# Patient Record
Sex: Male | Born: 1935 | Race: White | Hispanic: No | Marital: Married | Smoking: Current some day smoker
Health system: Southern US, Community
[De-identification: ages and names within clinical notes are randomized; demographics above are authoritative.]

## PROBLEM LIST (undated history)

## (undated) DIAGNOSIS — I714 Abdominal aortic aneurysm, without rupture, unspecified: Secondary | ICD-10-CM

## (undated) DIAGNOSIS — I10 Essential (primary) hypertension: Secondary | ICD-10-CM

## (undated) DIAGNOSIS — M4310 Spondylolisthesis, site unspecified: Secondary | ICD-10-CM

## (undated) DIAGNOSIS — M5136 Other intervertebral disc degeneration, lumbar region: Secondary | ICD-10-CM

## (undated) DIAGNOSIS — G20A1 Parkinson's disease without dyskinesia, without mention of fluctuations: Secondary | ICD-10-CM

## (undated) DIAGNOSIS — G20 Parkinson's disease: Secondary | ICD-10-CM

## (undated) DIAGNOSIS — J449 Chronic obstructive pulmonary disease, unspecified: Secondary | ICD-10-CM

## (undated) DIAGNOSIS — M51369 Other intervertebral disc degeneration, lumbar region without mention of lumbar back pain or lower extremity pain: Secondary | ICD-10-CM

## (undated) DIAGNOSIS — R918 Other nonspecific abnormal finding of lung field: Secondary | ICD-10-CM

## (undated) HISTORY — PX: HERNIA REPAIR: SHX51

## (undated) HISTORY — DX: Other intervertebral disc degeneration, lumbar region without mention of lumbar back pain or lower extremity pain: M51.369

## (undated) HISTORY — DX: Other intervertebral disc degeneration, lumbar region: M51.36

## (undated) HISTORY — PX: JOINT REPLACEMENT: SHX530

## (undated) HISTORY — DX: Abdominal aortic aneurysm, without rupture: I71.4

## (undated) HISTORY — DX: Spondylolisthesis, site unspecified: M43.10

## (undated) HISTORY — PX: TOTAL KNEE ARTHROPLASTY: SHX125

## (undated) HISTORY — DX: Abdominal aortic aneurysm, without rupture, unspecified: I71.40

---

## 1999-09-11 ENCOUNTER — Ambulatory Visit (HOSPITAL_COMMUNITY): Admission: RE | Admit: 1999-09-11 | Discharge: 1999-09-11 | Payer: Self-pay | Admitting: Gastroenterology

## 1999-09-11 ENCOUNTER — Encounter (INDEPENDENT_AMBULATORY_CARE_PROVIDER_SITE_OTHER): Payer: Self-pay | Admitting: Specialist

## 2004-11-24 ENCOUNTER — Ambulatory Visit (HOSPITAL_COMMUNITY): Admission: RE | Admit: 2004-11-24 | Discharge: 2004-11-25 | Payer: Self-pay | Admitting: Orthopedic Surgery

## 2006-09-20 ENCOUNTER — Ambulatory Visit (HOSPITAL_COMMUNITY): Admission: RE | Admit: 2006-09-20 | Discharge: 2006-09-21 | Payer: Self-pay | Admitting: General Surgery

## 2009-06-09 ENCOUNTER — Encounter: Admission: RE | Admit: 2009-06-09 | Discharge: 2009-06-09 | Payer: Self-pay | Admitting: Neurology

## 2009-09-30 ENCOUNTER — Encounter: Admission: RE | Admit: 2009-09-30 | Discharge: 2009-11-20 | Payer: Self-pay | Admitting: Neurology

## 2010-01-06 ENCOUNTER — Encounter: Admission: RE | Admit: 2010-01-06 | Discharge: 2010-01-06 | Payer: Self-pay | Admitting: Neurology

## 2010-01-15 ENCOUNTER — Encounter: Admission: RE | Admit: 2010-01-15 | Discharge: 2010-02-09 | Payer: Self-pay | Admitting: Neurology

## 2011-04-09 NOTE — Op Note (Signed)
NAME:  Jose Vega, Jose Vega                ACCOUNT NO.:  [id number]   MEDICAL RECORD NO.:  [id number]          PATIENT TYPE:  OIB   LOCATION:  1409                         FACILITY:  [facility]   PHYSICIAN:  Gita Kudo, M.D. DATE OF BIRTH:  1934-08-13   DATE OF PROCEDURE:  09/20/2006  DATE OF DISCHARGE:                                 OPERATIVE REPORT   OPERATIVE PROCEDURE:  Repair right inguinal hernia - combined direct and  indirect, excision lipomas of cord, Kugel-Bard two piece mesh.   SURGEON:  Gita Kudo, M.D.   ANESTHESIA:  General.   PREOPERATIVE DIAGNOSIS:  Right inguinal hernia.   POSTOPERATIVE DIAGNOSIS:  Right inguinal hernia, combined direct, indirect,  as well as lipomas of the cord.   CLINICAL SUMMARY:  77 year old male with enlarging bulge in his right groin.  It is symptomatic and he would like it repaired.  Physical examination  discloses a medium large right inguinal hernia.   OPERATIVE FINDINGS:  The patient had an indirect hernia sac that was empty  of contents at the time of surgery.  He had a medium sized right direct  hernia.  He also had lipomas of the cord.  The cord structures and nerves  were identified and not injured.   OPERATIVE PROCEDURE:  Under satisfactory general endotracheal anesthesia,  having received 1 grams Ancef preop, the patient was positioned, prepped and  draped in a standard fashion.  A total of 30 mL of 0.5% Marcaine with  epinephrine was infiltrated for postoperative analgesia.  A transverse right  lower quadrant incision was made and carried down to and through the  external ring and external oblique.  Bleeders were coagulated or tied with  Vicryl.  The cord and its contents mobilized with a Penrose drain, self-  retaining retractors placed, and with excellent exposure, the cord was  encircled with a Penrose and dissected.  The indirect sac identified,  dissected from the lipomas, and ligated high with 0 Prolene suture and  excess trimmed away.  The lipomas, likewise, were dissected high and tied  with 3-0 Vicryl, and excised.  The floor of the canal was then opened with  cautery from the pubis to the internal ring.  Finger dissection used to  reduce the preperitoneal fatty contents and they were held away with a  moistened gauze.  The circular portion of Kugel mesh was anchored at  Cooper's ligament with a 0 Prolene suture and unfolded inferiorly and  laterally.  Then, the gauze was removed and the mesh unfolded superiorly and  medially.  It lay in excellent position and a second 0 Prolene suture was  used to tack the medial portion to the under surface of the abdominal wall.  The mesh extended from the pubis to the internal ring and lay beneath the  inferior epigastric vessels.  The floor was then closed over the mesh with a  running 0 Prolene taking intermittent bites of the mesh and when tied at the  internal ring, the ring was snug and the ends of the suture left long.  The  oval portion  of the mesh was then tailored to fit in the floor with a slit  made to go around the cord structures.  It was anchored at the internal ring  with the previous suture and then tacked around the periphery.  Three  interrupted 0 Prolene sutures were used to approximate it to the internal  oblique, soft tissue near the pubis, and inguinal ligament.  The tails were  brought around the cord and sutured to each other.  The wound was then  checked for hemostasis, lavaged with saline, and  closed in layers.  Running 2-0 Vicryl approximated the external oblique,  interrupted 2-0 Vicryl for the deep fascia, 3-0 Vicryl for subcu, Steri-  Strips for skin.  Sterile absorbent dressings were then applied.  He  tolerated the procedure well.  No complications.  To the recovery room in  good condition.           ______________________________  Gita Kudo, M.D.     MRL/MEDQ  D:  09/20/2006  T:  09/20/2006  Job:  [id number]

## 2011-10-01 ENCOUNTER — Other Ambulatory Visit: Payer: Self-pay | Admitting: Neurology

## 2011-10-01 DIAGNOSIS — M545 Low back pain: Secondary | ICD-10-CM

## 2011-10-07 ENCOUNTER — Ambulatory Visit
Admission: RE | Admit: 2011-10-07 | Discharge: 2011-10-07 | Disposition: A | Discharge status: Home / Self Care | Payer: Medicare Other | Source: Ambulatory Visit | Attending: Neurology | Admitting: Neurology

## 2011-10-07 DIAGNOSIS — M545 Low back pain: Secondary | ICD-10-CM

## 2012-05-26 ENCOUNTER — Emergency Department (HOSPITAL_COMMUNITY): Payer: Medicare Other

## 2012-05-26 ENCOUNTER — Emergency Department (HOSPITAL_COMMUNITY)
Admission: EM | Admit: 2012-05-26 | Discharge: 2012-05-26 | Disposition: A | Discharge status: Home / Self Care | Payer: Medicare Other | Attending: Emergency Medicine | Admitting: Emergency Medicine

## 2012-05-26 ENCOUNTER — Encounter (HOSPITAL_COMMUNITY): Payer: Self-pay | Admitting: *Deleted

## 2012-05-26 DIAGNOSIS — M25551 Pain in right hip: Secondary | ICD-10-CM

## 2012-05-26 DIAGNOSIS — G20A1 Parkinson's disease without dyskinesia, without mention of fluctuations: Secondary | ICD-10-CM | POA: Insufficient documentation

## 2012-05-26 DIAGNOSIS — I10 Essential (primary) hypertension: Secondary | ICD-10-CM | POA: Insufficient documentation

## 2012-05-26 DIAGNOSIS — M25559 Pain in unspecified hip: Secondary | ICD-10-CM | POA: Insufficient documentation

## 2012-05-26 DIAGNOSIS — G20 Parkinson's disease: Secondary | ICD-10-CM | POA: Insufficient documentation

## 2012-05-26 HISTORY — DX: Parkinson's disease: G20

## 2012-05-26 HISTORY — DX: Essential (primary) hypertension: I10

## 2012-05-26 HISTORY — DX: Parkinson's disease without dyskinesia, without mention of fluctuations: G20.A1

## 2012-05-26 LAB — BASIC METABOLIC PANEL
BUN: 21 mg/dL (ref 6–23)
CO2: 29 mEq/L (ref 19–32)
Calcium: 9.4 mg/dL (ref 8.4–10.5)
Chloride: 104 mEq/L (ref 96–112)
Creatinine, Ser: 1.27 mg/dL (ref 0.50–1.35)
GFR calc Af Amer: 62 mL/min — ABNORMAL LOW (ref >90)
Glucose, Bld: 94 mg/dL (ref 70–99)
Potassium: 4.3 mEq/L (ref 3.5–5.1)
Sodium: 141 mEq/L (ref 135–145)

## 2012-05-26 LAB — CBC
HCT: 40.5 % (ref 39.0–52.0)
MCH: 29 pg (ref 26.0–34.0)
MCHC: 32.8 g/dL (ref 30.0–36.0)
Platelets: 218 10*3/uL (ref 150–400)
RBC: 4.58 MIL/uL (ref 4.22–5.81)
RDW: 13.9 % (ref 11.5–15.5)
WBC: 8.9 10*3/uL (ref 4.0–10.5)

## 2012-05-26 MED ORDER — HYDROCODONE-ACETAMINOPHEN 5-500 MG PO TABS: 1.0000 | ORAL_TABLET | Freq: Four times a day (QID) | ORAL | Status: AC | PRN

## 2012-05-26 MED ORDER — KETOROLAC TROMETHAMINE 60 MG/2ML IM SOLN
60.0000 mg | Freq: Once | INTRAMUSCULAR | Status: AC
  Administered 2012-05-26: 60 mg via INTRAMUSCULAR
  Filled 2012-05-26: qty 2

## 2012-05-26 NOTE — ED Notes (Signed)
RUE:[id number]<JW> Expected date:05/26/12<BR> Expected time: 8:22 PM<BR> Means of arrival:Ambulance<BR> Comments:<BR> Hip injury

## 2012-05-26 NOTE — ED Notes (Signed)
Per EMS:  Pt is from home, st's around 3 weeks ago he was mowing his lawn and went into a hole and tried to pick up mower and started having hip pain.  Pain has only gotten worse since then and has now radiated to L hip and he can no longer ambulate due to the severe pain.  Pt able to move legs.

## 2012-05-26 NOTE — ED Provider Notes (Signed)
History     CSN: [id number]  Arrival date & time 05/26/12  2025   First MD Initiated Contact with Patient 05/26/12 2047      No chief complaint on file.    The history is provided by the patient.   the patient reports developing left hip pain approximately 3 weeks ago and then over the past week has developed new right hip pain.  His left hip is significantly improved.  It relates with a walker secondary to Parkinson's disease his wife reports that he is not very mobile these days.  He spends a significant amount of time in bed.  He denies numbness or tingling or weakness of his right lower extremity.  He has no abdominal pain.  He denies low back pain.  His had no unilateral leg swelling.  He reports he can walk but it does hurt his right hip.  He denies fevers or chills.  He has had no new rash.  Past Medical History  Diagnosis Date  . Parkinson's disease   . Hypertension     Past Surgical History  Procedure Date  . Total knee arthroplasty     bilateral    No family history on file.  History  Substance Use Topics  . Smoking status: Not on file  . Smokeless tobacco: Not on file  . Alcohol Use:       Review of Systems  All other systems reviewed and are negative.    Allergies  Ibuprofen  Home Medications   Current Outpatient Rx  Name Route Sig Dispense Refill  . AMLODIPINE BESYLATE 5 MG PO TABS Oral Take 5 mg by mouth daily.    Marland Kitchen CARBIDOPA-LEVODOPA 25-100 MG PO TBDP Oral Take 1 tablet by mouth 3 (three) times daily. Take one and a half tablets by mouth three times a day.    Marland Kitchen GABAPENTIN 300 MG PO CAPS Oral Take 300 mg by mouth 3 (three) times daily.    Marland Kitchen SELEGILINE HCL 5 MG PO CAPS Oral Take 5 mg by mouth daily. Take 1 tablet by mouth daily for 1 month, then 1 tablet in the morning and 1 tablet at noon thereafter.    Marland Kitchen HYDROCODONE-ACETAMINOPHEN 5-500 MG PO TABS Oral Take 1-2 tablets by mouth every 6 (six) hours as needed for pain. 25 tablet 0    BP 176/96   Pulse 77  Temp 97.5 F (36.4 C) (Oral)  Resp 18  SpO2 98%  Physical Exam  Nursing note and vitals reviewed. Constitutional: He is oriented to person, place, and time. He appears well-developed and well-nourished.  HENT:  Head: Normocephalic and atraumatic.  Eyes: EOM are normal.  Neck: Normal range of motion.  Cardiovascular: Normal rate, regular rhythm, normal heart sounds and intact distal pulses.   Pulmonary/Chest: Effort normal and breath sounds normal. No respiratory distress.  Abdominal: Soft. He exhibits no distension. There is no tenderness.  Musculoskeletal: Normal range of motion.       Normal pulses in the right foot.  Patient with pain with range of motion of his right hip.  He can range his hip a significant amount.  No obvious rash overlying his right hip  Neurological: He is alert and oriented to person, place, and time.  Skin: Skin is warm and dry.  Psychiatric: He has a normal mood and affect. Judgment normal.    ED Course  Procedures (including critical care time)  Labs Reviewed  BASIC METABOLIC PANEL - Abnormal; Notable for the following:  GFR calc non Af Amer 54 (*)     GFR calc Af Amer 62 (*)     All other components within normal limits  CBC   Dg Hip Complete Right  05/26/2012  *RADIOLOGY REPORT*  Clinical Data: Right hip pain.  Pain with walking.  RIGHT HIP - COMPLETE 2+ VIEW  Comparison: None.  Findings: Hips are located.  No evidence of pelvic fracture or sacral fracture.  No evidence of femoral neck fracture.  Dedicated view of the right hip demonstrates no femoral neck fracture.  IMPRESSION: No evidence of pelvic fracture, sacral fracture, or hip fracture.  Original Report Authenticated By: Genevive Bi, M.D.    I personally reviewed the imaging tests through PACS system  I reviewed available ER/hospitalization records thought the EMR   1. Hip pain, right       MDM  Patient's x-rays are normal.  You can range his right hip.  His no signs  of suggest this is a septic arthritis.  He can ambulate although he does have pain in that right hip.  Close PCP and orthopedic followup.  Home with pain medications.  I did some range of motion exercises at the bedside and seems to improve the patient's pain.  I think the majority of this may be from him being less mobile and developing early contractures        Lyanne Co, MD 05/26/12 2320

## 2012-05-27 NOTE — ED Notes (Signed)
Opened chart to clarify prescription for pharmacy

## 2012-06-16 ENCOUNTER — Ambulatory Visit: Payer: Medicare Other | Attending: Neurology | Admitting: Rehabilitative and Restorative Service Providers"

## 2012-06-16 DIAGNOSIS — R262 Difficulty in walking, not elsewhere classified: Secondary | ICD-10-CM | POA: Insufficient documentation

## 2012-06-16 DIAGNOSIS — R269 Unspecified abnormalities of gait and mobility: Secondary | ICD-10-CM | POA: Insufficient documentation

## 2012-06-16 DIAGNOSIS — IMO0001 Reserved for inherently not codable concepts without codable children: Secondary | ICD-10-CM | POA: Insufficient documentation

## 2012-06-20 ENCOUNTER — Ambulatory Visit: Payer: Medicare Other | Admitting: Physical Therapy

## 2012-06-21 ENCOUNTER — Ambulatory Visit: Payer: Medicare Other | Admitting: Physical Therapy

## 2012-06-27 ENCOUNTER — Ambulatory Visit: Payer: Medicare Other | Attending: Neurology | Admitting: Physical Therapy

## 2012-06-27 DIAGNOSIS — IMO0001 Reserved for inherently not codable concepts without codable children: Secondary | ICD-10-CM | POA: Insufficient documentation

## 2012-06-27 DIAGNOSIS — R269 Unspecified abnormalities of gait and mobility: Secondary | ICD-10-CM | POA: Insufficient documentation

## 2012-06-27 DIAGNOSIS — R262 Difficulty in walking, not elsewhere classified: Secondary | ICD-10-CM | POA: Insufficient documentation

## 2012-06-30 ENCOUNTER — Ambulatory Visit: Payer: Medicare Other | Admitting: Physical Therapy

## 2012-07-05 ENCOUNTER — Ambulatory Visit: Payer: Medicare Other | Admitting: Rehabilitative and Restorative Service Providers"

## 2012-07-07 ENCOUNTER — Ambulatory Visit: Payer: Medicare Other | Admitting: *Deleted

## 2012-07-10 ENCOUNTER — Ambulatory Visit: Payer: Medicare Other | Admitting: Physical Therapy

## 2012-07-12 ENCOUNTER — Ambulatory Visit: Payer: Medicare Other | Admitting: Physical Therapy

## 2012-07-19 ENCOUNTER — Ambulatory Visit: Payer: Medicare Other | Admitting: Rehabilitative and Restorative Service Providers"

## 2012-07-21 ENCOUNTER — Ambulatory Visit: Payer: Medicare Other | Admitting: Physical Therapy

## 2012-07-25 ENCOUNTER — Ambulatory Visit: Payer: Medicare Other | Attending: Neurology | Admitting: Physical Therapy

## 2012-07-25 DIAGNOSIS — R262 Difficulty in walking, not elsewhere classified: Secondary | ICD-10-CM | POA: Insufficient documentation

## 2012-07-25 DIAGNOSIS — IMO0001 Reserved for inherently not codable concepts without codable children: Secondary | ICD-10-CM | POA: Insufficient documentation

## 2012-07-25 DIAGNOSIS — R269 Unspecified abnormalities of gait and mobility: Secondary | ICD-10-CM | POA: Insufficient documentation

## 2012-07-28 ENCOUNTER — Ambulatory Visit: Payer: Medicare Other | Admitting: Rehabilitative and Restorative Service Providers"

## 2012-08-01 ENCOUNTER — Ambulatory Visit: Payer: Medicare Other | Admitting: Rehabilitative and Restorative Service Providers"

## 2012-08-04 ENCOUNTER — Ambulatory Visit: Payer: Medicare Other | Admitting: Rehabilitative and Restorative Service Providers"

## 2012-08-07 ENCOUNTER — Ambulatory Visit: Payer: Medicare Other | Admitting: Rehabilitative and Restorative Service Providers"

## 2012-08-11 ENCOUNTER — Ambulatory Visit: Payer: Medicare Other | Admitting: Rehabilitative and Restorative Service Providers"

## 2012-09-29 ENCOUNTER — Encounter (HOSPITAL_COMMUNITY): Payer: Self-pay | Admitting: Emergency Medicine

## 2012-09-29 ENCOUNTER — Emergency Department (HOSPITAL_COMMUNITY): Payer: Medicare Other

## 2012-09-29 ENCOUNTER — Emergency Department (HOSPITAL_COMMUNITY)
Admission: EM | Admit: 2012-09-29 | Discharge: 2012-09-29 | Disposition: A | Discharge status: Home / Self Care | Payer: Medicare Other | Attending: Emergency Medicine | Admitting: Emergency Medicine

## 2012-09-29 DIAGNOSIS — W1809XA Striking against other object with subsequent fall, initial encounter: Secondary | ICD-10-CM | POA: Insufficient documentation

## 2012-09-29 DIAGNOSIS — F17200 Nicotine dependence, unspecified, uncomplicated: Secondary | ICD-10-CM | POA: Insufficient documentation

## 2012-09-29 DIAGNOSIS — Y9389 Activity, other specified: Secondary | ICD-10-CM | POA: Insufficient documentation

## 2012-09-29 DIAGNOSIS — Y929 Unspecified place or not applicable: Secondary | ICD-10-CM | POA: Insufficient documentation

## 2012-09-29 DIAGNOSIS — G20 Parkinson's disease: Secondary | ICD-10-CM | POA: Insufficient documentation

## 2012-09-29 DIAGNOSIS — S72109A Unspecified trochanteric fracture of unspecified femur, initial encounter for closed fracture: Secondary | ICD-10-CM | POA: Insufficient documentation

## 2012-09-29 DIAGNOSIS — G20A1 Parkinson's disease without dyskinesia, without mention of fluctuations: Secondary | ICD-10-CM | POA: Insufficient documentation

## 2012-09-29 DIAGNOSIS — I10 Essential (primary) hypertension: Secondary | ICD-10-CM | POA: Insufficient documentation

## 2012-09-29 MED ORDER — OXYCODONE-ACETAMINOPHEN 5-325 MG PO TABS: 1.0000 | ORAL_TABLET | ORAL | Status: DC | PRN

## 2012-09-29 MED ORDER — ACETAMINOPHEN 325 MG PO TABS
650.0000 mg | ORAL_TABLET | Freq: Once | ORAL | Status: AC
  Administered 2012-09-29: 650 mg via ORAL
  Filled 2012-09-29: qty 2

## 2012-09-29 MED ORDER — OXYCODONE-ACETAMINOPHEN 5-325 MG PO TABS
1.0000 | ORAL_TABLET | Freq: Once | ORAL | Status: AC
  Administered 2012-09-29: 1 via ORAL
  Filled 2012-09-29: qty 1

## 2012-09-29 NOTE — ED Notes (Addendum)
TO ED via [facility]- on arrival was on BB/ C-collar/ Headblocks- all removed upon arrival to triage per Levy Sjogren, NP, [facility], and Tobin Chad, NT.  C/o of lower back pain radiates to right buttock and down right leg. Was on riding lawn mower on a slope, slid off seat to the right, landed on right hip, hit gear shift during fall. Alert/oriented x 3, moves all extremities, states back hurts when Right leg is moved.

## 2012-09-29 NOTE — ED Provider Notes (Signed)
Medical screening examination/treatment/procedure(s) were conducted as a shared visit with non-physician practitioner(s) and myself.  I personally evaluated the patient during the encounter Has parkinson's fell onto right hip.  C/o pain. No head injury. No other injuries.   + gt troch. Injury.   Pt of dr. Lajoyce Corners. Will consult duda.  I do not think pt needs surgery.    Cheri Guppy, MD 09/29/12 [phone]

## 2012-09-29 NOTE — ED Notes (Signed)
[facility] arrived to pick pt up, condition stable at time of discharge home.

## 2012-09-29 NOTE — Progress Notes (Signed)
ED CM consulted by Triage RN about a RW for pt. ED CM requested an order and informed Brigette, Triage Rn assigned to pt that rolling walkers (RW) are no longer delivered to hospitals at this time by the home health agencies. Family have to pick them up from the home care offices especially the only opened agency in the evening, [facility]. The [facility] office closes at 8 pm and it is presently 14 The family will not be able to go to [facility] office within this time frame.  Cm recommended order to enter for possible delivery on Monday by [facility], a Rx be given to pt for pick up of a RW at a local medical supply store (possibly [facility]). Thurston Hole, NP, concluded she would provide pt with a  Rx for RW.

## 2012-09-29 NOTE — Progress Notes (Signed)
[facility] ED CM reviewed ED clinicals (labs, VSS, imaging) Recommend outpatient

## 2012-10-03 ENCOUNTER — Inpatient Hospital Stay (HOSPITAL_COMMUNITY)
Admission: EM | Admit: 2012-10-03 | Discharge: 2012-10-06 | DRG: 965 | Disposition: A | Discharge status: Skilled Nursing Facility | Payer: Medicare Other | Attending: Family Medicine | Admitting: Family Medicine

## 2012-10-03 ENCOUNTER — Encounter (HOSPITAL_COMMUNITY): Payer: Self-pay | Admitting: Emergency Medicine

## 2012-10-03 DIAGNOSIS — G20 Parkinson's disease: Secondary | ICD-10-CM | POA: Diagnosis present

## 2012-10-03 DIAGNOSIS — Z886 Allergy status to analgesic agent status: Secondary | ICD-10-CM

## 2012-10-03 DIAGNOSIS — F17200 Nicotine dependence, unspecified, uncomplicated: Secondary | ICD-10-CM | POA: Diagnosis present

## 2012-10-03 DIAGNOSIS — Z79899 Other long term (current) drug therapy: Secondary | ICD-10-CM

## 2012-10-03 DIAGNOSIS — M25551 Pain in right hip: Secondary | ICD-10-CM | POA: Diagnosis present

## 2012-10-03 DIAGNOSIS — W1789XA Other fall from one level to another, initial encounter: Secondary | ICD-10-CM | POA: Diagnosis present

## 2012-10-03 DIAGNOSIS — S72033A Displaced midcervical fracture of unspecified femur, initial encounter for closed fracture: Secondary | ICD-10-CM | POA: Diagnosis present

## 2012-10-03 DIAGNOSIS — Z96659 Presence of unspecified artificial knee joint: Secondary | ICD-10-CM

## 2012-10-03 DIAGNOSIS — S3210XA Unspecified fracture of sacrum, initial encounter for closed fracture: Secondary | ICD-10-CM | POA: Diagnosis present

## 2012-10-03 DIAGNOSIS — Z66 Do not resuscitate: Secondary | ICD-10-CM | POA: Diagnosis present

## 2012-10-03 DIAGNOSIS — S72101A Unspecified trochanteric fracture of right femur, initial encounter for closed fracture: Secondary | ICD-10-CM | POA: Diagnosis present

## 2012-10-03 DIAGNOSIS — K5900 Constipation, unspecified: Secondary | ICD-10-CM | POA: Diagnosis present

## 2012-10-03 DIAGNOSIS — R300 Dysuria: Secondary | ICD-10-CM | POA: Diagnosis present

## 2012-10-03 DIAGNOSIS — S329XXA Fracture of unspecified parts of lumbosacral spine and pelvis, initial encounter for closed fracture: Secondary | ICD-10-CM | POA: Diagnosis present

## 2012-10-03 DIAGNOSIS — S32509A Unspecified fracture of unspecified pubis, initial encounter for closed fracture: Principal | ICD-10-CM | POA: Diagnosis present

## 2012-10-03 DIAGNOSIS — G20A1 Parkinson's disease without dyskinesia, without mention of fluctuations: Secondary | ICD-10-CM | POA: Diagnosis present

## 2012-10-03 DIAGNOSIS — S72009A Fracture of unspecified part of neck of unspecified femur, initial encounter for closed fracture: Secondary | ICD-10-CM | POA: Diagnosis present

## 2012-10-03 DIAGNOSIS — Y9279 Other farm location as the place of occurrence of the external cause: Secondary | ICD-10-CM

## 2012-10-03 DIAGNOSIS — I10 Essential (primary) hypertension: Secondary | ICD-10-CM | POA: Diagnosis present

## 2012-10-03 DIAGNOSIS — W309XXA Contact with unspecified agricultural machinery, initial encounter: Secondary | ICD-10-CM

## 2012-10-03 DIAGNOSIS — R3911 Hesitancy of micturition: Secondary | ICD-10-CM | POA: Diagnosis present

## 2012-10-03 LAB — URINE MICROSCOPIC-ADD ON

## 2012-10-03 LAB — CBC WITH DIFFERENTIAL/PLATELET
Hemoglobin: 12.8 g/dL — ABNORMAL LOW (ref 13.0–17.0)
Lymphocytes Relative: 10 % — ABNORMAL LOW (ref 12–46)
Lymphs Abs: 1.1 10*3/uL (ref 0.7–4.0)
Monocytes Relative: 8 % (ref 3–12)
Neutro Abs: 8.9 10*3/uL — ABNORMAL HIGH (ref 1.7–7.7)
Neutrophils Relative %: 78 % — ABNORMAL HIGH (ref 43–77)
RBC: 4.21 MIL/uL — ABNORMAL LOW (ref 4.22–5.81)
WBC: 11.3 10*3/uL — ABNORMAL HIGH (ref 4.0–10.5)

## 2012-10-03 LAB — BASIC METABOLIC PANEL
BUN: 28 mg/dL — ABNORMAL HIGH (ref 6–23)
Chloride: 103 mEq/L (ref 96–112)
Glucose, Bld: 100 mg/dL — ABNORMAL HIGH (ref 70–99)
Potassium: 4.6 mEq/L (ref 3.5–5.1)

## 2012-10-03 LAB — URINALYSIS, ROUTINE W REFLEX MICROSCOPIC
Bilirubin Urine: NEGATIVE
Glucose, UA: NEGATIVE mg/dL
Hgb urine dipstick: NEGATIVE
Specific Gravity, Urine: 1.024 (ref 1.005–1.030)
Urobilinogen, UA: 1 mg/dL (ref 0.0–1.0)

## 2012-10-03 MED ORDER — ONDANSETRON HCL 4 MG/2ML IJ SOLN: 4.0000 mg | Freq: Four times a day (QID) | INTRAMUSCULAR | Status: DC | PRN

## 2012-10-03 MED ORDER — SELEGILINE HCL 5 MG PO CAPS
5.0000 mg | ORAL_CAPSULE | Freq: Every day | ORAL | Status: DC
  Administered 2012-10-03 – 2012-10-06 (×4): 5 mg via ORAL
  Filled 2012-10-03 (×5): qty 1

## 2012-10-03 MED ORDER — SODIUM CHLORIDE 0.9 % IV SOLN: INTRAVENOUS | Status: DC

## 2012-10-03 MED ORDER — ACETAMINOPHEN 325 MG PO TABS
650.0000 mg | ORAL_TABLET | Freq: Four times a day (QID) | ORAL | Status: DC | PRN
  Administered 2012-10-05: 650 mg via ORAL
  Filled 2012-10-03: qty 2

## 2012-10-03 MED ORDER — ONDANSETRON HCL 4 MG PO TABS: 4.0000 mg | ORAL_TABLET | Freq: Four times a day (QID) | ORAL | Status: DC | PRN

## 2012-10-03 MED ORDER — CARBIDOPA-LEVODOPA 25-100 MG PO TABS
1.0000 | ORAL_TABLET | Freq: Three times a day (TID) | ORAL | Status: DC
  Administered 2012-10-03 – 2012-10-06 (×8): 1 via ORAL
  Filled 2012-10-03 (×10): qty 1

## 2012-10-03 MED ORDER — ENOXAPARIN SODIUM 40 MG/0.4ML ~~LOC~~ SOLN
40.0000 mg | SUBCUTANEOUS | Status: DC
  Administered 2012-10-03 – 2012-10-05 (×3): 40 mg via SUBCUTANEOUS
  Filled 2012-10-03 (×4): qty 0.4

## 2012-10-03 MED ORDER — ACETAMINOPHEN 650 MG RE SUPP: 650.0000 mg | Freq: Four times a day (QID) | RECTAL | Status: DC | PRN

## 2012-10-03 MED ORDER — POLYETHYLENE GLYCOL 3350 17 G PO PACK
17.0000 g | PACK | Freq: Every day | ORAL | Status: DC
  Administered 2012-10-03 – 2012-10-06 (×4): 17 g via ORAL
  Filled 2012-10-03 (×4): qty 1

## 2012-10-03 MED ORDER — CARBIDOPA-LEVODOPA 25-100 MG PO TBDP: 1.5000 | ORAL_TABLET | Freq: Three times a day (TID) | ORAL | Status: DC

## 2012-10-03 MED ORDER — MORPHINE SULFATE 4 MG/ML IJ SOLN
4.0000 mg | Freq: Once | INTRAMUSCULAR | Status: AC
Start: 2012-10-03 — End: 2012-10-03
  Administered 2012-10-03: 4 mg via INTRAVENOUS
  Filled 2012-10-03: qty 1

## 2012-10-03 MED ORDER — DOCUSATE SODIUM 100 MG PO CAPS
100.0000 mg | ORAL_CAPSULE | Freq: Two times a day (BID) | ORAL | Status: DC
  Administered 2012-10-03 – 2012-10-06 (×6): 100 mg via ORAL
  Filled 2012-10-03 (×6): qty 1

## 2012-10-03 MED ORDER — AMLODIPINE BESYLATE 5 MG PO TABS
5.0000 mg | ORAL_TABLET | Freq: Every day | ORAL | Status: DC
  Administered 2012-10-03 – 2012-10-06 (×4): 5 mg via ORAL
  Filled 2012-10-03 (×4): qty 1

## 2012-10-03 MED ORDER — MORPHINE SULFATE 2 MG/ML IJ SOLN
2.0000 mg | INTRAMUSCULAR | Status: DC | PRN
  Administered 2012-10-03 – 2012-10-04 (×3): 2 mg via INTRAVENOUS
  Filled 2012-10-03 (×3): qty 1

## 2012-10-03 NOTE — ED Provider Notes (Signed)
78 year old male had fallen and been diagnosed with an avulsion fracture of the greater trochanter of the right femur. He was sent home with prescriptions for Percocet, but he has not been able to tolerate pain at home. He had spoken with his orthopedic physician and advised that he come to the ED to be admitted for pain control. He states he feels fine at rest but he has severe pain with any movement. On exam, there is no tenderness over the greater trochanter but there is severe pain with movement of the right hip.  I saw and evaluated the patient, reviewed the resident's note and I agree with the findings and plan.   Dione Booze, MD 10/03/12 [phone]

## 2012-10-03 NOTE — ED Notes (Signed)
Pt with fall off his lawn mower last Friday. Seen at [facility] ED and d/c'd home with office f/u with Dr. Lajoyce Corners today. Today patient arrives via EMS from Dr. Audrie Lia office, patient with severe pain at home and unable ambulate since fall.

## 2012-10-03 NOTE — ED Provider Notes (Signed)
History     CSN: [id number]  Arrival date & time 10/03/12  1525   First MD Initiated Contact with Patient 10/03/12 1623      Chief Complaint  Patient presents with  . Hip Pain    (Consider location/radiation/quality/duration/timing/severity/associated sxs/prior treatment) HPI Comments: Patient fell off a tractor on Friday and sustained a fracture to his greater trochanter of his right femur. He was seen in the orthopedic clinic today and they sent him to the ER to be admitted for pain control since his oral pain medicine was not controlling his symptoms well enough at home.  Patient is a 78 y.o. male presenting with hip pain. The history is provided by the patient and the spouse. No language interpreter was used.  Hip Pain This is a new problem. The current episode started in the past 7 days. The problem occurs constantly. The problem has been unchanged. Associated symptoms include arthralgias (r hip) and weakness (r hip 2/2 pain). Pertinent negatives include no abdominal pain, chest pain, coughing, diaphoresis, nausea, neck pain, sore throat or vomiting. The symptoms are aggravated by bending, walking and twisting. He has tried oral narcotics for the symptoms. The treatment provided no relief.    Past Medical History  Diagnosis Date  . Parkinson's disease   . Hypertension     Past Surgical History  Procedure Date  . Total knee arthroplasty     bilateral  . Hernia repair 7 years ago    No family history on file.  History  Substance Use Topics  . Smoking status: Current Every Day Smoker    Types: Cigars  . Smokeless tobacco: Not on file  . Alcohol Use: 0.6 oz/week    1 Glasses of wine per week     Comment: 1 glass of wine at night      Review of Systems  Constitutional: Negative for diaphoresis, activity change and appetite change.  HENT: Negative for sore throat and neck pain.   Eyes: Negative for discharge and visual disturbance.  Respiratory: Negative for  cough, choking and shortness of breath.   Cardiovascular: Negative for chest pain and leg swelling.  Gastrointestinal: Negative for nausea, vomiting, abdominal pain, diarrhea and constipation.  Genitourinary: Negative for dysuria and difficulty urinating.  Musculoskeletal: Positive for arthralgias (r hip) and gait problem. Negative for back pain.  Skin: Negative for color change and pallor.  Neurological: Positive for weakness (r hip 2/2 pain). Negative for dizziness, speech difficulty and light-headedness.  Psychiatric/Behavioral: Negative for behavioral problems and agitation.  All other systems reviewed and are negative.    Allergies  Aspirin adult low and Ibuprofen  Home Medications   Current Outpatient Rx  Name  Route  Sig  Dispense  Refill  . ACETAMINOPHEN 325 MG PO TABS   Oral   Take 650 mg by mouth at bedtime as needed. For pain         . AMLODIPINE BESYLATE 5 MG PO TABS   Oral   Take 5 mg by mouth daily.         Marland Kitchen CARBIDOPA-LEVODOPA 25-100 MG PO TBDP   Oral   Take 1.5 tablets by mouth 3 (three) times daily.          Marland Kitchen DICLOFENAC SODIUM 1 % TD GEL   Topical   Apply 2-4 g topically 4 (four) times daily as needed. For arthritis pain         . OXYCODONE-ACETAMINOPHEN 5-325 MG PO TABS   Oral   Take 1  tablet by mouth every 4 (four) hours as needed. For pain         . SELEGILINE HCL 5 MG PO CAPS   Oral   Take 5 mg by mouth daily.            BP 156/98  Pulse 87  Temp 98.3 F (36.8 C) (Oral)  Resp 14  SpO2 92%  Physical Exam  Constitutional: He appears well-developed. No distress.  HENT:  Head: Normocephalic and atraumatic.  Mouth/Throat: No oropharyngeal exudate.  Eyes: EOM are normal. Pupils are equal, round, and reactive to light. Right eye exhibits no discharge. Left eye exhibits no discharge.  Neck: Normal range of motion. Neck supple. No JVD present.  Cardiovascular: Normal rate, regular rhythm and normal heart sounds.   Pulmonary/Chest:  Effort normal and breath sounds normal. No stridor. No respiratory distress. He has no wheezes. He has no rales. He exhibits no tenderness.  Abdominal: Soft. Bowel sounds are normal. There is no tenderness. There is no guarding.  Genitourinary: Penis normal.  Musculoskeletal: He exhibits tenderness (TTP over R greaeter troch and R lower buttock. R hip held in 30deg flexion, ROM limited 2/2 pain). He exhibits no edema.       2+ DP, no sens defs  Neurological: He is alert. No cranial nerve deficit. He exhibits normal muscle tone.  Skin: Skin is warm and dry. He is not diaphoretic. No erythema. No pallor.  Psychiatric: He has a normal mood and affect. His behavior is normal. Judgment and thought content normal.    ED Course  Procedures (including critical care time)  Labs Reviewed  CBC WITH DIFFERENTIAL - Abnormal; Notable for the following:    WBC 11.3 (*)     RBC 4.21 (*)     Hemoglobin 12.8 (*)     HCT 38.6 (*)     Neutrophils Relative 78 (*)     Neutro Abs 8.9 (*)     Lymphocytes Relative 10 (*)     All other components within normal limits  BASIC METABOLIC PANEL - Abnormal; Notable for the following:    Glucose, Bld 100 (*)     BUN 28 (*)     GFR calc non Af Amer 55 (*)     GFR calc Af Amer 63 (*)     All other components within normal limits   No results found.   1. Right hip pain       MDM  Sent from ortho clinic to be admitted for pain ctrl for R greater troch fem fx pt sustained 4 days ago. Percocet q4h not controlling pain, not able to get around. No new inj. Will give morphine and reassess.  Admitted in stable condition for pain ctrl       Warrick Parisian, MD 10/03/12 1939

## 2012-10-03 NOTE — H&P (Signed)
Family Medicine Teaching [facility] Admission History and Physical  Patient name: JACORI MULROONEY Medical record number: [id number] Date of birth: 1934/01/27 Age: 78 y.o. Gender: male  Primary Care Provider: Pcp Not In System  Chief Complaint: right hip pain History of Present Illness: Chidubem A Moody is a 78 y.o. year old male with history of parkinsons disease and DJD presenting with severe right hip pain from his orthopedist office today. His wife assists with history. He apparently fell from his lawnmower four days ago and was evaluated in ED at [facility]. Plain film showed a mild avulsion fracture of right greater trochanter. He was discharged with conservative management at that time, and has been at home unable to bear any weight or stand without assistance due to severe pain in right posterior hip/buttock and right groin area. Patient followed up with Dr. Lajoyce Corners today, and was sent to ER for pain control.   Currently his pain is improved from 9 to 7/10 after one dose of morphine. He is hungry and wants to eat.  ROS: Additionally, they endorse constipation (last BM prior to injury), difficulty initiating urine stream, urgency, amber urine, mild dysuria, inability to stand at home. Wife feels unable to care for him currently.  Denies abdominal pain, penile discharge/swelling, emesis, confusion, rash.  Patient Active Problem List  Diagnosis  . Right hip pain  . Hip avulsion fracture  . Hypertension  . Parkinson disease   Past Medical History: Past Medical History  Diagnosis Date  . Parkinson's disease   . Hypertension     Past Surgical History: Past Surgical History  Procedure Date  . Total knee arthroplasty     bilateral  . Hernia repair 7 years ago    Social History: History   Social History  . Marital Status: Married    Spouse Name: N/A    Number of Children: N/A  . Years of Education: N/A   Social History Main Topics  . Smoking status: Current Every Day Smoker   Types: Cigars  . Smokeless tobacco: Not on file  . Alcohol Use: 0.6 oz/week    1 Glasses of wine per week     Comment: 1 glass of wine at night  . Drug Use: No  . Sexually Active:    Other Topics Concern  . Not on file   Social History Narrative  . No narrative on file    Family History: No family history on file.  Allergies: Allergies  Allergen Reactions  . Aspirin Adult Low (Aspirin) Other (See Comments)    GI BLEED/ulcers  . Ibuprofen     Irritates stomach    Current Facility-Administered Medications  Medication Dose Route Frequency Provider Last Rate Last Dose  . [COMPLETED] morphine 4 MG/ML injection 4 mg  4 mg Intravenous Once Warrick Parisian, MD   4 mg at 10/03/12 1701   Current Outpatient Prescriptions  Medication Sig Dispense Refill  . acetaminophen (TYLENOL) 325 MG tablet Take 650 mg by mouth at bedtime as needed. For pain      . amLODipine (NORVASC) 5 MG tablet Take 5 mg by mouth daily.      . carbidopa-levodopa (PARCOPA) 25-100 MG per disintegrating tablet Take 1.5 tablets by mouth 3 (three) times daily.       . diclofenac sodium (VOLTAREN) 1 % GEL Apply 2-4 g topically 4 (four) times daily as needed. For arthritis pain      . oxyCODONE-acetaminophen (PERCOCET/ROXICET) 5-325 MG per tablet Take 1 tablet by mouth every  4 (four) hours as needed. For pain      . selegiline (ELDEPRYL) 5 MG capsule Take 5 mg by mouth daily.        Review Of Systems: Per HPI with the following additions:no chest pain, dyspnea, confusion, speech changes, rash, swelling, falls.  Otherwise 12 point review of systems was performed and was unremarkable.  Physical Exam: Pulse: 87  Blood Pressure: 156/98 RR 14   O2: 92% RA Temp: 98.3  General: alert, cooperative and no distress HEENT: PERRLA, extra ocular movement intact and dry lips. tobacco staining on tongue Heart: S1, S2 normal, no murmur, rub or gallop, regular rate and rhythm Lungs: clear to auscultation, no wheezes or rales and  unlabored breathing Abdomen: abdomen is soft without significant tenderness, masses, organomegaly or guarding Extremities: no edema or skin abnormalities. nontender over greater trochanter. bradykinesia. Severe pain with minimal right hip internal or external rotation. Skin:no rashes Neurology: no tremor. slurred speech improves with concentration (baseline). bradykinesia all extremities.alert/oriented x4.  Labs and Imaging: Lab Results  Component Value Date/Time   NA 141 10/03/2012  4:47 PM   K 4.6 10/03/2012  4:47 PM   CL 103 10/03/2012  4:47 PM   CO2 29 10/03/2012  4:47 PM   BUN 28* 10/03/2012  4:47 PM   CREATININE 1.24 10/03/2012  4:47 PM   GLUCOSE 100* 10/03/2012  4:47 PM   Lab Results  Component Value Date   WBC 11.3* 10/03/2012   HGB 12.8* 10/03/2012   HCT 38.6* 10/03/2012   MCV 91.7 10/03/2012   PLT 164 10/03/2012   Results for orders placed during the hospital encounter of 10/03/12 (from the past 24 hour(s))  CBC WITH DIFFERENTIAL     Status: Abnormal   Collection Time   10/03/12  4:47 PM      Component Value Range   WBC 11.3 (*) 4.0 - 10.5 K/uL   RBC 4.21 (*) 4.22 - 5.81 MIL/uL   Hemoglobin 12.8 (*) 13.0 - 17.0 g/dL   HCT 16.1 (*) 09.6 - 04.5 %   MCV 91.7  78.0 - 100.0 fL   MCH 30.4  26.0 - 34.0 pg   MCHC 33.2  30.0 - 36.0 g/dL   RDW 40.9  81.1 - 91.4 %   Platelets 164  150 - 400 K/uL   Neutrophils Relative 78 (*) 43 - 77 %   Neutro Abs 8.9 (*) 1.7 - 7.7 K/uL   Lymphocytes Relative 10 (*) 12 - 46 %   Lymphs Abs 1.1  0.7 - 4.0 K/uL   Monocytes Relative 8  3 - 12 %   Monocytes Absolute 1.0  0.1 - 1.0 K/uL   Eosinophils Relative 3  0 - 5 %   Eosinophils Absolute 0.3  0.0 - 0.7 K/uL   Basophils Relative 0  0 - 1 %   Basophils Absolute 0.0  0.0 - 0.1 K/uL  BASIC METABOLIC PANEL     Status: Abnormal   Collection Time   10/03/12  4:47 PM      Component Value Range   Sodium 141  135 - 145 mEq/L   Potassium 4.6  3.5 - 5.1 mEq/L   Chloride 103  96 - 112 mEq/L    CO2 29  19 - 32 mEq/L   Glucose, Bld 100 (*) 70 - 99 mg/dL   BUN 28 (*) 6 - 23 mg/dL   Creatinine, Ser 7.82  0.50 - 1.35 mg/dL   Calcium 9.6  8.4 - 95.6 mg/dL  GFR calc non Af Amer 55 (*) >90 mL/min   GFR calc Af Amer 63 (*) >90 mL/min      Assessment and Plan: Itzel A Casasola is a 78 y.o. year old male with parkinson's disease and HTN, DJD presenting with uncontrolled right hip pain and inability to ambulate secondary to injury and right greater trochanter avulsion fracture.  1. Right hip pain s/p fall 4 days ago. Severe pain limits ambulation, failed outpatient analgesia. It seems out of proportion to mild avulsion fracture, and location not congruent either. Wife reports plain film repeated in Ortho office today, and MD questioned need for CT scan for further assessment.  -continue IV morphine 2 mg q3 hr pain control. -tylenol q6hr prn -Non-weight bearing tonight -Plan to discuss with primary orthopedist Dr. Lajoyce Corners and consider CT scanning if indicated. -PT/OT assessment in am, may need short term rehab if unable to improve function with analgesia.  2. Urinary symptoms. Some new hesitancy and dysuria. Patient has a mild leukocytosis also. -check UA, micro to assess for UTI -check PVR, may have some retention after narcotics or secondary to pain.  3. parkinsons -continue home sinemet and seligilene.  4. HTN. Mild elevation with pain. -continue norvasc  5. Constipation. Maybe secondary to narcotic vs pain. -miralax and colace BID.  6. FEN/GI: regular diet. NS at 50 cc/hr. miralax and colace.  7. Prophylaxis: Lovenox sq  8. Disposition: PT/OT to eval tomorrow after discussion with orthopedics. May need SNF for short term rehab vs HH PT. Patient desires DNR status.  Lloyd Huger, MD [facility] Resident - PGY-3 10/03/2012 7:16 PM

## 2012-10-04 ENCOUNTER — Inpatient Hospital Stay (HOSPITAL_COMMUNITY): Payer: Medicare Other

## 2012-10-04 DIAGNOSIS — S72009A Fracture of unspecified part of neck of unspecified femur, initial encounter for closed fracture: Secondary | ICD-10-CM

## 2012-10-04 DIAGNOSIS — S72101A Unspecified trochanteric fracture of right femur, initial encounter for closed fracture: Secondary | ICD-10-CM | POA: Diagnosis present

## 2012-10-04 DIAGNOSIS — N390 Urinary tract infection, site not specified: Secondary | ICD-10-CM

## 2012-10-04 DIAGNOSIS — S329XXA Fracture of unspecified parts of lumbosacral spine and pelvis, initial encounter for closed fracture: Secondary | ICD-10-CM | POA: Diagnosis present

## 2012-10-04 LAB — CBC
HCT: 35.5 % — ABNORMAL LOW (ref 39.0–52.0)
Hemoglobin: 11.7 g/dL — ABNORMAL LOW (ref 13.0–17.0)
MCHC: 33 g/dL (ref 30.0–36.0)
MCV: 90.8 fL (ref 78.0–100.0)
RDW: 13.5 % (ref 11.5–15.5)
WBC: 8.6 10*3/uL (ref 4.0–10.5)

## 2012-10-04 LAB — BASIC METABOLIC PANEL
BUN: 25 mg/dL — ABNORMAL HIGH (ref 6–23)
CO2: 26 mEq/L (ref 19–32)
Chloride: 107 mEq/L (ref 96–112)
Creatinine, Ser: 1.1 mg/dL (ref 0.50–1.35)

## 2012-10-04 MED ORDER — HYDROCODONE-ACETAMINOPHEN 5-325 MG PO TABS
1.0000 | ORAL_TABLET | ORAL | Status: DC | PRN
  Administered 2012-10-04: 1 via ORAL
  Filled 2012-10-04: qty 1

## 2012-10-04 NOTE — Progress Notes (Signed)
PT Cancellation Note  Patient Details Name: Jose Vega MRN: [id number] DOB: 1934-05-25   Cancelled Treatment:      due to CT scan revealed multiple pelvic/sacral fractures.  Please re-order PT/OT evaluation if pt is still appropriate.  Acute PT/OT signing off.     Willie Plain 10/04/2012, 11:33 AM Theron Arista L. Shyra Emile DPT [phone]

## 2012-10-04 NOTE — Progress Notes (Signed)
Family Medicine Teaching Service Daily Progress Note Intern Pager: [phone]  Patient name: LABIB CWYNAR Medical record number: [id number] Date of birth: Oct 14, 1934 Age: 78 y.o. Gender: male  Primary Care Provider: Pcp Not In System  Subjective: Pt seen at bedside this morning, still with pain in his hip, groin, and buttock on the right. Otherwise "feels okay," and denies N/V, fever, chills, chest pain, or SOB.  Objective: Temp:  [98.2 F (36.8 C)-98.4 F (36.9 C)] 98.2 F (36.8 C) (11/13 0437) Pulse Rate:  [84-90] 90  (11/13 0437) Resp:  [14-18] 18  (11/13 0437) BP: (137-157)/(71-98) 137/71 mmHg (11/13 0437) SpO2:  [92 %-98 %] 93 % (11/13 0437) Exam: General: elderly adult man lying in bed, in NAD, alert/cooperative Cardio: RRR, S1, S2 normal, no murmur Lungs: clear to auscultation, no wheeze Abdomen: soft without tenderness or masses Extremities: no LE edema, bradykinesia in all four extremities; pain with minimal right leg/hip passive ROM  Tender to palpation over right bony prominences of hip/femer as well as right inner thigh/groin Skin:no rashes noted, warm/dry Neuro: no tremor; slurred speech improves with concentration (baseline); alert/oriented x4  Laboratory:  Lab 10/04/12 0600 10/03/12 1647  WBC 8.6 11.3*  HGB 11.7* 12.8*  HCT 35.5* 38.6*  PLT 153 164    Lab 10/04/12 0600 10/03/12 1647  NA 143 141  K 4.1 4.6  CL 107 103  CO2 26 29  BUN 25* 28*  CREATININE 1.10 1.24  CALCIUM 9.0 9.6  PROT -- --  BILITOT -- --  ALKPHOS -- --  ALT -- --  AST -- --  GLUCOSE 93 100*    Imaging/Diagnostic Tests: CT pending this AM  Assessment and Plan:Linken A Tokunaga is a 78 y.o. year old male with parkinson's disease and HTN, DJD presenting with uncontrolled right hip pain and inability to ambulate secondary to injury and right greater trochanter avulsion fracture.   1. Right hip pain s/p fall 4 days ago. Severe pain limits ambulation, failed outpatient analgesia. Wife  reports plain film repeated in Ortho office today, and MD questioned need for CT scan for further assessment.  -CT obtained this morning, showing right sacral, pubic rami, and symphasis pubis fractures as well as refracture of right greater trochanter; informed Dr. Audrie Lia office and will await further ortho rec's -Non-weight bearing, NPO for possible procedures -continue IV morphine 2 mg q3h, Tylenol q6h for pain control -PT/OT assessment on hold pending further ortho work-up/procedures   2. Urinary symptoms. Some new hesitancy and dysuria per history. Patient has a mild leukocytosis also.  -Pt states he urinated morning of 11/13 without problem; will monitor clinically -UA with 3-6 WBC and trace LE, will await cultures before initiating any abx  3. Hx of Parkinson's - continue home inemet and seligilene.   4. HTN. Mild elevation with pain. Continue Norvasc, monitor with vitals  5. Constipation. May be secondary to narcotic vs pain. Miralax and Colace BID.   FEN/GI: NPO for possible ortho procedure, NS to 75 cc/hr Prophylaxis: Lovenox sq  Disposition: PT/OT to eval pending any further ortho work-up/procedures; pt may need SNF placement for short term rehab vs HH PT.  Code: Patient desires DNR  Street, Cristal Deer, MD 10/04/2012, 9:44 AM

## 2012-10-04 NOTE — Progress Notes (Signed)
Nutrition Education Note  RD drawn to chart secondary to current prescription of Selegiline, a medication with potential interactions with high tyramine-containing foods.  RD provided "Tyramine-Restricted Nutrition Therapy" handout from the Academy of [employer] and discussed importance of this restriction while taking current medication. Reviewed list of foods to avoid.  Expect fair compliance.  There is no height or weight on file to calculate BMI.   Current diet order is NPO.  100% intake when on regular diet.  Reports eating well at home  Additional labs and medications reviewed.   Pt reports good intake until 5 days ago with decrease secondary to increased pain.  Well nourished prior to admit.  RD will change current diet to Low Tyramine Diet when diet advanced to prevent potential food-medication interaction.   No further nutrition interventions warranted at this time. RD contact information provided. If additional nutrition issues arise, please re-consult RD.  Oran Rein, RD, LDN Clinical Inpatient Dietitian Pager:  [phone] Weekend and after hours pager:  [phone]

## 2012-10-04 NOTE — Consult Note (Signed)
Reason for Consult: Pelvic ring fracture Referring Physician: [name] is an 78 y.o. male.  HPI: Patient is a 78 year old gentleman who fell off his riding lawnmower. Patient initially had radiographs in the emergency room which showed a fracture through the tip of the greater trochanter. Patient went home was having difficulty with weightbearing and is admitted this time do to inability to weight-bear at home.  Past Medical History  Diagnosis Date  . Parkinson's disease   . Hypertension     Past Surgical History  Procedure Date  . Total knee arthroplasty     bilateral  . Hernia repair 7 years ago    History reviewed. No pertinent family history.  Social History:  reports that he has been smoking Cigars.  He does not have any smokeless tobacco history on file. He reports that he drinks about .6 ounces of alcohol per week. He reports that he does not use illicit drugs.  Allergies:  Allergies  Allergen Reactions  . Aspirin Adult Low (Aspirin) Other (See Comments)    GI BLEED/ulcers  . Ibuprofen     Irritates stomach    Medications: I have reviewed the patient's current medications.  Results for orders placed during the hospital encounter of 10/03/12 (from the past 48 hour(s))  CBC WITH DIFFERENTIAL     Status: Abnormal   Collection Time   10/03/12  4:47 PM      Component Value Range Comment   WBC 11.3 (*) 4.0 - 10.5 K/uL    RBC 4.21 (*) 4.22 - 5.81 MIL/uL    Hemoglobin 12.8 (*) 13.0 - 17.0 g/dL    HCT 09.8 (*) 11.9 - 52.0 %    MCV 91.7  78.0 - 100.0 fL    MCH 30.4  26.0 - 34.0 pg    MCHC 33.2  30.0 - 36.0 g/dL    RDW 14.7  82.9 - 56.2 %    Platelets 164  150 - 400 K/uL    Neutrophils Relative 78 (*) 43 - 77 %    Neutro Abs 8.9 (*) 1.7 - 7.7 K/uL    Lymphocytes Relative 10 (*) 12 - 46 %    Lymphs Abs 1.1  0.7 - 4.0 K/uL    Monocytes Relative 8  3 - 12 %    Monocytes Absolute 1.0  0.1 - 1.0 K/uL    Eosinophils Relative 3  0 - 5 %    Eosinophils  Absolute 0.3  0.0 - 0.7 K/uL    Basophils Relative 0  0 - 1 %    Basophils Absolute 0.0  0.0 - 0.1 K/uL   BASIC METABOLIC PANEL     Status: Abnormal   Collection Time   10/03/12  4:47 PM      Component Value Range Comment   Sodium 141  135 - 145 mEq/L    Potassium 4.6  3.5 - 5.1 mEq/L    Chloride 103  96 - 112 mEq/L    CO2 29  19 - 32 mEq/L    Glucose, Bld 100 (*) 70 - 99 mg/dL    BUN 28 (*) 6 - 23 mg/dL    Creatinine, Ser 1.30  0.50 - 1.35 mg/dL    Calcium 9.6  8.4 - 86.5 mg/dL    GFR calc non Af Amer 55 (*) >90 mL/min    GFR calc Af Amer 63 (*) >90 mL/min   URINALYSIS, ROUTINE W REFLEX MICROSCOPIC     Status: Abnormal   Collection  Time   10/03/12  9:40 PM      Component Value Range Comment   Color, Urine AMBER (*) YELLOW BIOCHEMICALS MAY BE AFFECTED BY COLOR   APPearance CLEAR  CLEAR    Specific Gravity, Urine 1.024  1.005 - 1.030    pH 5.5  5.0 - 8.0    Glucose, UA NEGATIVE  NEGATIVE mg/dL    Hgb urine dipstick NEGATIVE  NEGATIVE    Bilirubin Urine NEGATIVE  NEGATIVE    Ketones, ur NEGATIVE  NEGATIVE mg/dL    Protein, ur 30 (*) NEGATIVE mg/dL    Urobilinogen, UA 1.0  0.0 - 1.0 mg/dL    Nitrite NEGATIVE  NEGATIVE    Leukocytes, UA TRACE (*) NEGATIVE   URINE MICROSCOPIC-ADD ON     Status: Abnormal   Collection Time   10/03/12  9:40 PM      Component Value Range Comment   Squamous Epithelial / LPF RARE  RARE    WBC, UA 3-6  <3 WBC/hpf    RBC / HPF 0-2  <3 RBC/hpf    Bacteria, UA FEW (*) RARE   BASIC METABOLIC PANEL     Status: Abnormal   Collection Time   10/04/12  6:00 AM      Component Value Range Comment   Sodium 143  135 - 145 mEq/L    Potassium 4.1  3.5 - 5.1 mEq/L    Chloride 107  96 - 112 mEq/L    CO2 26  19 - 32 mEq/L    Glucose, Bld 93  70 - 99 mg/dL    BUN 25 (*) 6 - 23 mg/dL    Creatinine, Ser 1.61  0.50 - 1.35 mg/dL    Calcium 9.0  8.4 - 09.6 mg/dL    GFR calc non Af Amer 63 (*) >90 mL/min    GFR calc Af Amer 73 (*) >90 mL/min   CBC     Status:  Abnormal   Collection Time   10/04/12  6:00 AM      Component Value Range Comment   WBC 8.6  4.0 - 10.5 K/uL    RBC 3.91 (*) 4.22 - 5.81 MIL/uL    Hemoglobin 11.7 (*) 13.0 - 17.0 g/dL    HCT 04.5 (*) 40.9 - 52.0 %    MCV 90.8  78.0 - 100.0 fL    MCH 29.9  26.0 - 34.0 pg    MCHC 33.0  30.0 - 36.0 g/dL    RDW 81.1  91.4 - 78.2 %    Platelets 153  150 - 400 K/uL     Ct Hip Right Wo Contrast  10/04/2012  *RADIOLOGY REPORT*  Clinical Data: Right hip pain since a fall on 09/29/2012.  CT OF THE RIGHT HIP WITHOUT CONTRAST  Technique:  Multidetector CT imaging was performed according to the standard protocol. Multiplanar CT image reconstructions were also generated.  Comparison: Radiographs dated 11/08 and 05/26/2012  Findings: There are subtle fractures of the right inferior and superior pubic rami as well as a fracture of the right side of the symphysis pubis.  The fracture of the superior pubic ramus is at the junction with the acetabulum.  There is also a slight impaction fracture of the right side of the sacrum.  There is an old avulsion fracture of the tip of the greater trochanter of the proximal right femur but this fragment is more displaced than on prior studies and I suspect the patient may have refractured through the tip  of the greater trochanter.  There is an old deformity of the anterior-superior aspect of the ilium consistent with an old fracture, healed.  IMPRESSION:  1.  Fractures of the right side of the sacrum and of the right inferior superior pubic rami and right side of the symphysis pubis. 2.  Probable refracture through the tip of the right greater trochanter.   Original Report Authenticated By: Francene Boyers, M.D.     Review of Systems  All other systems reviewed and are negative.   Blood pressure 131/80, pulse 80, temperature 97.8 F (36.6 C), temperature source Oral, resp. rate 18, SpO2 93.00%. Physical Exam On examination patient has no pain with range of motion of his  hip. Patient does have pain with weightbearing. Radiographs shows a old avulsion fracture off the tip of the greater trochanter CT scan shows a very small fractures through the sacrum and the inferior and superior pubic rami. Assessment/Plan: Assessment: Fracture through the sacrum and inferior and superior pubic rami fracture.  Plan: Due to patient's inability to weight-bear due to pain as well as inability for his wife to provide care  at home, patient will need discharge to short-term skilled nursing. I have consulted the social worker for discharge to skilled nursing. I will followup in the office 3 weeks after discharge.  Jose Vega,Jose Vega 10/04/2012, 6:45 PM

## 2012-10-04 NOTE — Progress Notes (Signed)
PT Cancellation Note  Patient Details Name: Jose Vega MRN: [id number] DOB: 04-Apr-1934   Cancelled Treatment:     Pt  Scheduled for CT scan of Right Hip this morning.  PT will evaluate pt after CT scan.  MD please update pt weightbearing status after CT scan results.  Pt is currently Non weight bearing. Thanks for the referral.  Theron Arista L. Callee Rohrig DPT [phone] Alferd Apa 10/04/2012, 8:40 AM

## 2012-10-04 NOTE — Progress Notes (Signed)
I agree with the following treatment note after reviewing documentation.   Johnston, Brealyn Baril Brynn   OTR/L Pager: [phone] Office: [phone] .   

## 2012-10-04 NOTE — H&P (Signed)
FMTS Attending Admission Note: Jose Don MD Personal pager:  [phone] FPTS Service Pager:  [phone]  I  have seen and examined this patient, reviewed their chart. I have discussed this patient with the resident. I agree with the resident's findings, assessment and care plan.  Briefly, 78 yo M with Parkison's, HTN who suffered fall while at home about 5 days ago, landed on Right hip.  Inability to walk since that time due to pain.  Presented to Orthopedist's yesterday, send to ED due to intractable pain.  Pain mostly in Right buttock region.  Treated with morphine and provided relief.  This AM he is doing well and has continued to improve.  Does endorse both constipation and hesitancy symptoms as per resident note.    PE: Gen:  Alert and oriented Heart:  RRR Lungs:  Clear Abdomen:  Benign.   Ext: TTP over Right greater trochanter.  Able to flex knee to 30 degrees before he experiences significant pain.  Some tenderness with both internal and external rotation of Right hip.  Pulses +2 DP Right leg  Imp/Plan: 1.  Avulsion fracture of superior aspect right greater femoral trochanter: - already scheduled for CT of his leg due to severity of pain out of proportion to previous radiographic findings. - continue Morphine for pain control. - early PT after CT leg. - Agree with consult to primary orthopedist.    2.  LUTS in elderly male: - Agree with PVR - Await urine culture

## 2012-10-04 NOTE — Progress Notes (Signed)
FMTS Attending Daily Note:  Jeff Cherrie Franca MD  [phone] pager  Family Practice pager:  [phone] I have seen and examined this patient and have reviewed their chart. I have discussed this patient with the resident. I agree with the resident's findings, assessment and care plan.  See HP note 

## 2012-10-05 MED ORDER — TRAMADOL HCL 50 MG PO TABS
50.0000 mg | ORAL_TABLET | Freq: Four times a day (QID) | ORAL | Status: DC | PRN
  Administered 2012-10-05 – 2012-10-06 (×3): 50 mg via ORAL
  Filled 2012-10-05 (×3): qty 1

## 2012-10-05 MED ORDER — ACETAMINOPHEN 325 MG PO TABS
650.0000 mg | ORAL_TABLET | ORAL | Status: DC | PRN
  Administered 2012-10-05 – 2012-10-06 (×3): 650 mg via ORAL
  Filled 2012-10-05 (×3): qty 2

## 2012-10-05 MED ORDER — ACETAMINOPHEN 650 MG RE SUPP: 650.0000 mg | RECTAL | Status: DC | PRN

## 2012-10-05 NOTE — Progress Notes (Signed)
FMTS Attending Daily Note:  Renold Don MD  [phone] pager  Family Practice pager:  [phone] I have seen and examined this patient and have reviewed their chart. I have discussed this patient with the resident. I agree with the resident's findings, assessment and care plan.  Patient doing well today, without pain.  Plan is for DC to rehab facility.

## 2012-10-05 NOTE — Evaluation (Signed)
Occupational Therapy Evaluation Patient Details Name: Jose Vega MRN: [id number] DOB: 03/07/1934 Today's Date: 10/05/2012 Time: 4098-1191 OT Time Calculation (min): 30 min  OT Assessment / Plan / Recommendation Clinical Impression  Pt 78 yo male s/p fx of right femur and multiple pelvic/sacral fx's. Pt disoriented and confused during session, pt wife stating this is due to pain meds. Would benefit from OT acutely to increase independence with ADL's.     OT Assessment  Patient needs continued OT Services    Follow Up Recommendations  SNF    Barriers to Discharge Other (comment) (Wife unable to care for pt by herself)    Equipment Recommendations  None recommended by OT    Recommendations for Other Services    Frequency  Min 2X/week    Precautions / Restrictions Precautions Precautions: Fall Restrictions Weight Bearing Restrictions: No   Pertinent Vitals/Pain No pain score stated, Pt requesting pain meds. RN Leotis Shames informed pt and wife waiting for Dr. To approve different pain meds.     ADL  Grooming: Performed;Denture care;Set up Where Assessed - Grooming: Supported sitting Toilet Transfer: Teacher, early years/pre: Patient Percentage: 70% Statistician Method: Sit to Barista: Bedside commode Equipment Used: Gait belt;Rolling walker Transfers/Ambulation Related to ADLs: requires +2 (A) for safety and facilitation of trunk during turns and LLE to complete step through.    ADL Comments: Pt very confused throughout session. Requires max vc's to sequence. Ambulated to bathroom and transfered to 3n1 with +2 (A) to facilitate pt's trunk and LLE. Pt unable to void and ambulated back to chair and provided set-up for denture care.     OT Diagnosis: Generalized weakness;Cognitive deficits;Acute pain  OT Problem List: Decreased strength;Decreased activity tolerance;Decreased cognition;Decreased safety awareness;Decreased knowledge of  use of DME or AE;Decreased knowledge of precautions;Pain OT Treatment Interventions: Self-care/ADL training;Therapeutic exercise;DME and/or AE instruction;Therapeutic activities;Patient/family education   OT Goals Acute Rehab OT Goals OT Goal Formulation: With patient Time For Goal Achievement: 10/19/12 Potential to Achieve Goals: Good ADL Goals Pt Will Perform Grooming: with supervision;Standing at sink ADL Goal: Grooming - Progress: Goal set today Pt Will Perform Lower Body Dressing: with min assist;Sit to stand from chair;with adaptive equipment ((AE PRN) ) ADL Goal: Lower Body Dressing - Progress: Goal set today Pt Will Transfer to Toilet: with supervision;Ambulation;Raised toilet seat with arms ADL Goal: Toilet Transfer - Progress: Goal set today Pt Will Perform Toileting - Clothing Manipulation: with supervision;Sitting on 3-in-1 or toilet ADL Goal: Toileting - Clothing Manipulation - Progress: Goal set today Pt Will Perform Toileting - Hygiene: with supervision;Sitting on 3-in-1 or toilet ADL Goal: Toileting - Hygiene - Progress: Goal set today  Visit Information  Last OT Received On: 10/05/12 Assistance Needed: +2 (for safety )    Subjective Data  Subjective: I need to go to the commode Patient Stated Goal: Go to [employer]   Prior Functioning     Home Living Lives With: Spouse Available Help at Discharge: Skilled Nursing Facility Type of Home: House Home Access: Stairs to enter Secretary/administrator of Steps: 2 Entrance Stairs-Rails: None Home Layout: One level Bathroom Shower/Tub: Walk-in Contractor: Handicapped height Bathroom Accessibility: Yes How Accessible: Accessible via walker Home Adaptive Equipment: Walker - rolling;Grab bars in shower Prior Function Level of Independence: Independent with assistive device(s) Able to Take Stairs?: Yes Driving: No Vocation: Retired Musician: No difficulties Dominant Hand:  Right         Vision/Perception  Cognition  Overall Cognitive Status: Impaired Area of Impairment: Memory;Problem solving Arousal/Alertness: Awake/alert Orientation Level: Disoriented to;Situation Behavior During Session: WFL for tasks performed Memory Deficits: Unable to recall why he is in hospital  Safety/Judgement: Decreased awareness of safety precautions Problem Solving: max cueing for sequencing of RW with mobility    Extremity/Trunk Assessment Right Upper Extremity Assessment RUE ROM/Strength/Tone: [facility] for tasks assessed Left Upper Extremity Assessment LUE ROM/Strength/Tone: WFL for tasks assessed Trunk Assessment Trunk Assessment: Kyphotic     Mobility Bed Mobility Bed Mobility: Not assessed Transfers Transfers: Sit to Stand;Stand to Sit Sit to Stand: 1: +2 Total assist;With upper extremity assist;With armrests;From chair/3-in-1 Sit to Stand: Patient Percentage: 70% Stand to Sit: 1: +2 Total assist;To chair/3-in-1;With armrests;With upper extremity assist Stand to Sit: Patient Percentage: 80% Details for Transfer Assistance: cues and physical facilitation for hand placement and sequencing                    End of Session OT - End of Session Equipment Utilized During Treatment: Gait belt Activity Tolerance: Patient tolerated treatment well Patient left: in chair;with call bell/phone within reach;with family/visitor present Nurse Communication: Mobility status  GO     Cleora Fleet 10/05/2012, 10:15 AM

## 2012-10-05 NOTE — Evaluation (Signed)
Physical Therapy Evaluation Patient Details Name: Jose Vega MRN: [id number] DOB: July 13, 1934 Today's Date: 10/05/2012 Time: 1420-1450 PT Time Calculation (min): 30 min  PT Assessment / Plan / Recommendation Clinical Impression  Pt is a 78 y/o male with multiple pelvic/sacral fractures from fall.  Pt lives with is wife who is a frail elderly male.  Pt to d/c to SNF for rehab to promote independence with mobility.  Acute PT to follow pt.      PT Assessment  Patient needs continued PT services    Follow Up Recommendations  SNF;Supervision/Assistance - 24 hour    Does the patient have the potential to tolerate intense rehabilitation      Barriers to Discharge Decreased caregiver support      Equipment Recommendations  None recommended by PT;None recommended by OT    Recommendations for Other Services     Frequency Min 5X/week    Precautions / Restrictions Precautions Precautions: Fall Restrictions Weight Bearing Restrictions: No   Pertinent Vitals/Pain Pt c/o pain in R hip but unable to qualify.  Pt pain relieved with repositioning and rest.       Mobility  Bed Mobility Bed Mobility: Supine to Sit;Sit to Supine Supine to Sit: 4: Min assist;HOB flat Sit to Supine: 4: Min assist;HOB flat Details for Bed Mobility Assistance: Assist for Right LE management secondary to pain.  Transfers Transfers: Sit to Stand;Stand to Sit Sit to Stand: 3: Mod assist;From bed;With upper extremity assist;From elevated surface Sit to Stand: Patient Percentage: 70% Stand to Sit: 4: Min assist;To bed;With upper extremity assist Stand to Sit: Patient Percentage: 80% Details for Transfer Assistance: cues and physical facilitation for hand placement and sequencing Ambulation/Gait Ambulation/Gait Assistance: 4: Min assist Ambulation Distance (Feet): 15 Feet Assistive device: Rolling walker Ambulation/Gait Assistance Details: Step by step cueing for gait sequencing, Assist to manage walker.   Pt c/o increase pain with wb on R LE.   Gait Pattern: Step-to pattern;Decreased stride length;Decreased hip/knee flexion - right;Decreased hip/knee flexion - left;Decreased dorsiflexion - left;Decreased weight shift to right;Left steppage;Trunk flexed General Gait Details: Pt had difficulty clearing L foot first few steps.  After cueing pt able to clear left foot by exaggerated hip flexion in L swing phase (steppage) Stairs: No Wheelchair Mobility Wheelchair Mobility: No    Shoulder Instructions     Exercises     PT Diagnosis: Difficulty walking;Generalized weakness;Acute pain;Altered mental status  PT Problem List: Decreased activity tolerance;Decreased mobility;Decreased cognition;Decreased knowledge of use of DME;Decreased safety awareness;Pain;Decreased range of motion;Decreased strength PT Treatment Interventions: Gait training;DME instruction;Stair training;Functional mobility training;Therapeutic activities;Therapeutic exercise;Patient/family education   PT Goals Acute Rehab PT Goals PT Goal Formulation: With patient/family Time For Goal Achievement: 10/12/12 Potential to Achieve Goals: Good Pt will go Supine/Side to Sit: with supervision PT Goal: Supine/Side to Sit - Progress: Goal set today Pt will go Sit to Supine/Side: with supervision PT Goal: Sit to Supine/Side - Progress: Goal set today Pt will Transfer Bed to Chair/Chair to Bed: with supervision PT Transfer Goal: Bed to Chair/Chair to Bed - Progress: Goal set today Pt will Ambulate: 16 - 50 feet;with supervision;with rolling walker PT Goal: Ambulate - Progress: Goal set today  Visit Information  Last PT Received On: 10/05/12 Assistance Needed: +2 (for safety )    Subjective Data  Subjective: Agree to eval Patient Stated Goal: none stated   Prior Functioning  Home Living Lives With: Spouse Available Help at Discharge: Skilled Nursing Facility Type of Home: House Home Access: Stairs to  enter Water quality scientist of Steps: 2 Entrance Stairs-Rails: None Home Layout: One level Bathroom Shower/Tub: Walk-in Contractor: Handicapped height Bathroom Accessibility: Yes How Accessible: Accessible via walker Home Adaptive Equipment: Walker - rolling;Grab bars in shower Prior Function Level of Independence: Independent with assistive device(s) Able to Take Stairs?: Yes Driving: No Vocation: Retired Musician: No difficulties Dominant Hand: Right    Cognition  Overall Cognitive Status: Impaired Area of Impairment: Memory;Problem solving Arousal/Alertness: Awake/alert Orientation Level: Disoriented to;Situation Behavior During Session: WFL for tasks performed Memory Deficits: Unable to recall why he is in hospital  Safety/Judgement: Decreased awareness of safety precautions Problem Solving: max cueing for sequencing of RW with mobility    Extremity/Trunk Assessment Right Upper Extremity Assessment RUE ROM/Strength/Tone: [facility] for tasks assessed Left Upper Extremity Assessment LUE ROM/Strength/Tone: WFL for tasks assessed Right Lower Extremity Assessment RLE ROM/Strength/Tone: Unable to fully assess;Due to pain Left Lower Extremity Assessment LLE ROM/Strength/Tone: Unable to fully assess;Due to pain Trunk Assessment Trunk Assessment: Kyphotic   Balance Balance Balance Assessed: No  End of Session PT - End of Session Equipment Utilized During Treatment: Gait belt Activity Tolerance: Patient limited by pain;Patient limited by fatigue Patient left: in bed;with call bell/phone within reach Nurse Communication: Mobility status  GP     Yarisa Lynam 10/05/2012, 4:03 PM  Giavanni Zeitlin L. Crue Otero DPT [phone]

## 2012-10-05 NOTE — Progress Notes (Signed)
Family Medicine Teaching Service Daily Progress Note Intern Pager: [phone]  Patient name: Jose Vega Medical record number: [id number] Date of birth: 1934-04-30 Age: 78 y.o. Gender: male  Primary Care Provider: Pcp Not In System  Subjective: Pt seen at bedside this morning, still with pain in his hip/groin but better with pain medication. Otherwise "feels fine," and is without complaint. Specifically denies N/V, chest pain, abdominal pain, or SOB.  Per nursing, there was some concern for pt being over-sedated and confused with morphine and/or Norco.  Objective: Temp:  [97.8 F (36.6 C)-98.2 F (36.8 C)] 98 F (36.7 C) (11/14 0448) Pulse Rate:  [76-81] 81  (11/14 0448) Resp:  [18] 18  (11/14 0448) BP: (122-131)/(75-83) 122/75 mmHg (11/14 0448) SpO2:  [93 %-95 %] 95 % (11/14 0448) Exam: General: elderly adult man sitting up in chair, appears uncomfortable but in NAD, alert/cooperative Cardio: RRR, S1, S2 normal, no murmur Lungs: clear to auscultation, no wheeze Abdomen: soft without tenderness or masses Extremities: no LE edema, bradykinesia in all four extremities unchanged  Pain with ROM of right hip/leg  Tender to palpation over right bony prominences of hip/femer as well as right inner thigh/groin Skin: no rashes noted, warm/dry  Laboratory:  Lab 10/04/12 0600 10/03/12 1647  WBC 8.6 11.3*  HGB 11.7* 12.8*  HCT 35.5* 38.6*  PLT 153 164    Lab 10/04/12 0600 10/03/12 1647  NA 143 141  K 4.1 4.6  CL 107 103  CO2 26 29  BUN 25* 28*  CREATININE 1.10 1.24  CALCIUM 9.0 9.6  PROT -- --  BILITOT -- --  ALKPHOS -- --  ALT -- --  AST -- --  GLUCOSE 93 100*    Imaging/Diagnostic Tests: CT of Right Hip, 11/13 @1007  MPRESSION:  1. Fractures of the right side of the sacrum and of the right  inferior superior pubic rami and right side of the symphysis pubis.  2. Probable refracture through the tip of the right greater  trochanter.  Assessment and Plan:Jose A  Vega is a 78 y.o. year old male with parkinson's disease and HTN, DJD presenting with uncontrolled right hip pain and inability to ambulate secondary to injury and right greater trochanter avulsion fracture. Admitted 11/12.  1. Right hip pain s/p fall 4 days prior to presentation. Pain limits ambulation, failed outpatient analgesia. Wife reports plain film repeated in Ortho office, and MD questioned need for CT scan for further assessment.  -CT obtained 11/14, showing right sacral, pubic rami, and symphasis pubis fractures as well as refracture of right greater trochanter  -Dr. Lajoyce Corners (orthopedics) was consulted, recommending SNF placement for PT/acute rehab  -discussed with CSW this morning, will follow up  -Pt to follow up with Dr. Lajoyce Corners in 3 weeks -Tylenol q4h for pain control; d/c'd morphine and Norco, will substitute Ultram 50 mg q6 PRN  2. Urinary symptoms. Some new hesitancy and dysuria per history. Patient has a mild leukocytosis also.  -Pt states he urinated morning of 11/13 without problem; will monitor clinically -UOP has been adequate, pt without further complaints as of 11/14 -UA with 3-6 WBC and trace LE, urine cultures pending  3. Hx of Parkinson's - continue home inemet and seligilene.   4. HTN. Mild elevation with pain, has been improved 11/13-14. Continue Norvasc, monitor with vitals  5. Constipation. Reported at time of admission, but no further complaints. -May be secondary to narcotics vs pain.  -Miralax and Colace BID.   FEN/GI: regular diet (low tyramine due  to therapy with selegiline), saline lock IV Prophylaxis: Lovenox sq  Disposition: Management as above with pain control, SNF placement pending; CSW assistance appreciated Code: Patient desires DNR  Saburo Luger, Cristal Deer, MD 10/05/2012, 9:33 AM

## 2012-10-05 NOTE — Evaluation (Signed)
I agree with the following treatment note after reviewing documentation.   Johnston, Mansa Willers Brynn   OTR/L Pager: [phone] Office: [phone] .   

## 2012-10-05 NOTE — Clinical Social Work Psychosocial (Signed)
     Clinical Social Work Department BRIEF PSYCHOSOCIAL ASSESSMENT 10/05/2012  Patient:  Jose Vega, Jose Vega     Account Number:  [id number]     Admit date:  10/03/2012  Clinical Social Worker:  Tiburcio Pea  Date/Time:  10/05/2012 05:37 PM  Referred by:  Physician  Date Referred:  10/04/2012 Referred for  SNF Placement   Other Referral:   Interview type:  Other - See comment Other interview type:   Wife and patient    PSYCHOSOCIAL DATA Living Status:  WIFE Admitted from facility:   Level of care:   Primary support name:  Holdon Cordner  (c) [phone] Primary support relationship to patient:  SPOUSE Degree of support available:   Strong support    CURRENT CONCERNS Current Concerns  Post-Acute Placement   Other Concerns:    SOCIAL WORK ASSESSMENT / PLAN Met with patient and wife today- they are requesting short term SNF.  Wife would like [facility] if possible. Bed search discussed and initated and bed offers given. [facility] offered and can accept patient tomorow.  Patient and wife are pleased. She will transport patient via car.  Fl2 on chart for MD's signature.   Assessment/plan status:  Psychosocial Support/Ongoing Assessment of Needs Other assessment/ plan:   Information/referral to community resources:   SNF list /bed offers provided to patient and wife  Aftercare needs discussed- to be arranged by SNF when medically stable for d/c home.  Information provided regarding [facility]- Medicare Complete    PATIENTS/FAMILYS RESPONSE TO PLAN OF CARE: Patient is alert and is slightly confused at times.He is able to verabalize an undrestanding of the need for short term SNF. His wife plans to take him home after rehab. Patient and wife are in agreement and are pleased with bed offer from [facility].  Plan d/c tomorrow- MD states that patient is medically stable for d/c.

## 2012-10-05 NOTE — Progress Notes (Signed)
Patient ID: Jose Vega, male   DOB: 1934-09-18, 78 y.o.   MRN: [id number] Physical therapy progressive ambulation weightbearing as tolerated. Order for skilled nursing placement placed. I'll followup in office in 3 weeks.

## 2012-10-06 ENCOUNTER — Telehealth: Payer: Self-pay | Admitting: *Deleted

## 2012-10-06 DIAGNOSIS — R300 Dysuria: Secondary | ICD-10-CM | POA: Diagnosis present

## 2012-10-06 MED ORDER — DSS 100 MG PO CAPS: 100.0000 mg | ORAL_CAPSULE | Freq: Two times a day (BID) | ORAL | Status: DC | PRN

## 2012-10-06 MED ORDER — TRAMADOL HCL 50 MG PO TABS: 50.0000 mg | ORAL_TABLET | Freq: Four times a day (QID) | ORAL | Status: DC | PRN

## 2012-10-06 MED ORDER — OXYCODONE-ACETAMINOPHEN 5-325 MG PO TABS: 0.5000 | ORAL_TABLET | ORAL | Status: DC | PRN

## 2012-10-06 MED ORDER — HYDROCODONE-ACETAMINOPHEN 5-325 MG PO TABS: 0.5000 | ORAL_TABLET | Freq: Four times a day (QID) | ORAL | Status: DC | PRN

## 2012-10-06 MED ORDER — POLYETHYLENE GLYCOL 3350 17 G PO PACK: 17.0000 g | PACK | Freq: Two times a day (BID) | ORAL | Status: DC | PRN

## 2012-10-06 NOTE — Telephone Encounter (Signed)
Pt is currently on FPTS inpt service to be discharged to SNF, today; Rx for Ultram sent to [facility] by mistake. I spoke with pharmacist and discussed interaction of Ultram with selegiline; risk is for increased BP and serotonin syndrome. Ultram was being used in place of narcotics for concern for sedation/confusion with morphine/Norco. Will discontinue tramadol and use half-dose Norco for discharge to SNF. --CMS

## 2012-10-06 NOTE — Discharge Summary (Signed)
Family Medicine Teaching [facility] Discharge Summary  Patient name: COSME JACOB Medical record number: [id number] Date of birth: 12-26-1933 Age: 78 y.o. Gender: male Date of Admission: 10/03/2012  Date of Discharge: 10/06/2012 Admitting Physician: Tobey Grim, MD  Primary Care Provider: Pcp Not In System  Indication for Hospitalization: right hip pain Discharge Diagnoses:  Fractures of right pelvis Hip avulsion fracture Closed fracture of trochanter of right femur HTN Parkinson disease Dysuria  Consultations: Dr. Lajoyce Corners [facility] Orthopedics)  Significant Labs and Imaging:   Lab 10/04/12 0600 10/03/12 1647  WBC 8.6 11.3*  HGB 11.7* 12.8*  HCT 35.5* 38.6*  PLT 153 164    Lab 10/04/12 0600 10/03/12 1647  NA 143 141  K 4.1 4.6  CL 107 103  CO2 26 29  BUN 25* 28*  CREATININE 1.10 1.24  CALCIUM 9.0 9.6  PROT -- --  BILITOT -- --  ALKPHOS -- --  ALT -- --  AST -- --  GLUCOSE 93 100*   Urinalysis: significant for positive protein, trace LE, 3-6 WBC Urine culture (collected 11/12 @2140 ): FINAL RESULT PENDING (reincubated for better growth)  CT of Right Hip without contrast, 11/13 @1007  Comparison: Radiographs dated 11/08 and 05/26/2012  Findings: There are subtle fractures of the right inferior and  superior pubic rami as well as a fracture of the right side of the  symphysis pubis. The fracture of the superior pubic ramus is at  the junction with the acetabulum.  There is also a slight impaction fracture of the right side of the  sacrum.  There is an old avulsion fracture of the tip of the greater  trochanter of the proximal right femur but this fragment is more  displaced than on prior studies and I suspect the patient may have  refractured through the tip of the greater trochanter.  There is an old deformity of the anterior-superior aspect of the  ilium consistent with an old fracture, healed.  IMPRESSION:  1. Fractures of the right side of the  sacrum and of the right  inferior superior pubic rami and right side of the symphysis pubis.  2. Probable refracture through the tip of the right greater  trochanter.  Procedures: none  Brief Hospital Course: Lyfe A Danford is a 78 y.o. year old male with parkinson's disease and HTN, DJD presenting with uncontrolled right hip pain and inability to ambulate secondary to injury and right greater trochanter avulsion fracture. Admitted 11/12, to be discharged to [facility] 11/15. See below by problem list, as well as recommendations for follow-up and discharge instructions.  1. Right hip pain s/p fall 4 days prior to presentation. Pain limits ambulation, failed outpatient analgesia. Wife reports plain film repeated in Ortho office, and MD questioned need for CT scan for further assessment.  -CT obtained 11/14, showing right sacral, pubic rami, and symphasis pubis fractures as well as refracture of right greater trochanter  -Dr. Lajoyce Corners (orthopedics) was consulted   -recommended SNF placement for PT/acute rehab   -pt to follow up with Dr. Lajoyce Corners in 3 weeks  -Pain controlled with Tylenol q4h and Ultram 50 mg q6, PRN  -initially managed with morphine IV and Norco PO, but there was concern by pt's wife and nursing for sedation/confusion with narcotics  -Ultram discontinued (risk of increased BP and serotonin syndrome, interaction with selegiline)  -pt has Rx to be continued from prior to admission for Percocet  -would recommend preferentially controlling pain with Tylenol and/or half tablet of Norco and  reserve Percocet only for more severe pain  2. Urinary symptoms. Some new hesitancy and dysuria per history. Patient had a mild leukocytosis at time of admission 11.3 (improved without specific therapy at 8.6 on 11/13).  -Pt states he urinated morning of 11/13 without problem and states 11/14 his urination is "better" -UOP has been adequate, pt without further complaints as of 11/14  -UA with 3-6 WBC and  trace LE, final urine culture results pending (reincubated for better growth) -Not treated with abx as pt's leukocytosis resolved and he has been afebrile without further complaints.  3. Hx of Parkinson's - No new/worsening symptoms or complaint. Continued home Sinemet and selegiline.   4. HTN. Mild elevations with pain, but improved 11/13-14. Continued home Norvasc.  5. Constipation. Reported at time of presentation, but no further complaints since then. May have been secondary to narcotics vs pain. Managed with Miralax and Colace BID, BM reported the evening of the day before discharge.   Discharge Medications:    Medication List     As of 10/06/2012 11:06 AM    TAKE these medications         acetaminophen 325 MG tablet   Commonly known as: TYLENOL   Take 650 mg by mouth at bedtime as needed. For pain      amLODipine 5 MG tablet   Commonly known as: NORVASC   Take 5 mg by mouth daily.      carbidopa-levodopa 25-100 MG per disintegrating tablet   Commonly known as: PARCOPA   Take 1.5 tablets by mouth 3 (three) times daily.      diclofenac sodium 1 % Gel   Commonly known as: VOLTAREN   Apply 2-4 g topically 4 (four) times daily as needed. For arthritis pain      DSS 100 MG Caps   Take 100 mg by mouth 2 (two) times daily as needed for constipation.      HYDROcodone-acetaminophen 5-325 MG per tablet   Commonly known as: NORCO/VICODIN   Take 0.5 tablets by mouth every 6 (six) hours as needed for pain.      oxyCODONE-acetaminophen 5-325 MG per tablet   Commonly known as: PERCOCET/ROXICET   Take 0.5-1 tablets by mouth every 4 (four) hours as needed. For pain      polyethylene glycol packet   Commonly known as: MIRALAX / GLYCOLAX   Take 17 g by mouth 2 (two) times daily as needed (for constipation).      selegiline 5 MG capsule   Commonly known as: ELDEPRYL   Take 5 mg by mouth daily.        Issues for Follow Up:  1. Hip fractures/pain - Fractures not requiring  operative fixation at this point; pain controlled during hospital stay with Tylenol and Ultram. Some concern for sedation/confusion with narcotics, so would recommend using Tylenol and Ultram preferentially with Percocet for more severe pain. Pt should follow up with Dr. Lajoyce Corners in 3 weeks. Pt also reported some constipation, managed with Miralax and Colace.  2. Urinary symptoms - Pt reported some new hesitancy and dysuria at time of admission, with mild leukocytosis. Since then, pt has reported improvement. UA did show some WBCs and leukocyte esterase, so urine culture was obtained with final results still pending. Would follow up culture for any need for antibiotics and monitor for any further symptoms.  3. Chronic conditions - Parkinson's disease, HTN; continue previous meds (Sinemet, selegiline; amlodipine),  and monitor for any needed changes.  Outstanding Results: PENDING URINE  CULTURE RESULTS (reincubated for better growth, final result expected 11/16 or 11/17)  Discharge Instructions:  Per Dr. Lajoyce Corners (orthopedics), pt will need continued physical therapy for progressive ambulation, with weightbearing as tolerated. Pt will follow up with Dr. Lajoyce Corners in 3 weeks (may need to call for specific follow-up details). Pt's pain was controlled with Tylenol and Ultram during hospitalization; pt was also given Norco but there was concern for sedation and confusion with narcotics, per pt's wife and nursing. However, Ultram discontinued for risk of interaction with selegiline (risk of increased BP and serotonin syndrome). Pt has a Rx also for Percocet for severe pain from prior to admission, which can be resumed; however, recommend attempting to control pain with Tylenol and/or half-tablet of Norco before using Percocet. Pt has had some complaint of difficulty urinating but has not required catheterization. Would recommend continued monitoring. Pt has final urine culture results pending as of 11 AM, 11/15  Follow-up  Information    Follow up with DUDA,MARCUS V, MD. In 3 weeks.   Contact information:   [address] [place] [address] [phone]         Discharge Condition: stable  [address] [place], MD 10/06/2012, 11:06 AM

## 2012-10-06 NOTE — Progress Notes (Signed)
Family Medicine Teaching Service Daily Progress Note Intern Pager: [phone]  Patient name: JAROD BOZZO Medical record number: [id number] Date of birth: 09/25/1934 Age: 78 y.o. Gender: male  Primary Care Provider: Pcp Not In System  Subjective: Pt seen at bedside this morning. States pain has been much better controlled with addition of Ultram, yesterday. Specifically denies chest pain, SOB. States he had a bowel movement yesterday that made him feel better, and that his urination has been "better," with less hesitation than previously.  Objective: Temp:  [97.3 F (36.3 C)-97.7 F (36.5 C)] 97.7 F (36.5 C) (11/15 0622) Pulse Rate:  [64-77] 64  (11/15 0622) Resp:  [20] 20  (11/15 0622) BP: (140-144)/(78-89) 140/86 mmHg (11/15 0622) SpO2:  [99 %-100 %] 100 % (11/15 0622) Weight:  [216 lb 11.4 oz (98.3 kg)] 216 lb 11.4 oz (98.3 kg) (11/14 1525) Exam: General: elderly adult man lying in bed in NAD, alert/cooperative; appears more comfortable than yesterday Cardio: RRR, S1, S2 normal, no murmur Lungs: clear to auscultation, no wheezes Abdomen: soft without tenderness or masses; BS+ Extremities: no LE edema, bradykinesia in all four extremities unchanged  Pain with ROM of right hip/leg; able to flex knee but flexion/rotation of hip increases pain  Remains with tenderness over right bony prominences of hip/femur as well as right groin/anterior pelvis Skin: no rashes noted, warm/dry  Laboratory:  Lab 10/04/12 0600 10/03/12 1647  WBC 8.6 11.3*  HGB 11.7* 12.8*  HCT 35.5* 38.6*  PLT 153 164    Lab 10/04/12 0600 10/03/12 1647  NA 143 141  K 4.1 4.6  CL 107 103  CO2 26 29  BUN 25* 28*  CREATININE 1.10 1.24  CALCIUM 9.0 9.6  PROT -- --  BILITOT -- --  ALKPHOS -- --  ALT -- --  AST -- --  GLUCOSE 93 100*    Imaging/Diagnostic Tests: CT of Right Hip, 11/13 @1007  MPRESSION:  1. Fractures of the right side of the sacrum and of the right  inferior superior pubic rami and  right side of the symphysis pubis.  2. Probable refracture through the tip of the right greater  trochanter.  Assessment and Plan: Blaze A Virgil is a 78 y.o. year old male with parkinson's disease and HTN, DJD presenting with uncontrolled right hip pain and inability to ambulate secondary to injury and right greater trochanter avulsion fracture. Admitted 11/12. Pain better controlled with Tylenol and Ultram. Dysuria/hesitation improved.  1. Right hip pain s/p fall 4 days prior to presentation. Pain limits ambulation, failed outpatient analgesia. Wife reports plain film repeated in Ortho office, and MD questioned need for CT scan for further assessment.  -CT obtained 11/14, showing right sacral, pubic rami, and symphasis pubis fractures as well as refracture of right greater trochanter  -Dr. Lajoyce Corners (orthopedics) was consulted   -recommended SNF placement for PT/acute rehab   -pt to follow up with Dr. Lajoyce Corners in 3 weeks  -Pain controlled with Tylenol q4h and Ultram 50 mg q6, PRN  -initially managed with morphine IV and Norco PO, but there was concern by pt's wife and nursing for sedation/confusion with narcotics  -continue Tylenol, Ultram  2. Urinary symptoms. Some new hesitancy and dysuria per history. Patient had a mild leukocytosis at time of admission 11.3 (improved without specific therapy at 8.6 on 11/13).  -Pt states he urinated morning of 11/13 without problem and states 11/14 his urination is "better" -UOP has been adequate, pt without further complaints as of 11/14  -UA with  3-6 WBC and trace LE, final urine culture results pending (reincubated for better growth) -May consider empiric abx as final results will not be available until after discharge to SNF  3. Hx of Parkinson's - No new/worsening symptoms or complaint.  -Continue home Sinemet and selegiline.   4. HTN. Mild intermittent elevations with pain, but improved 11/13-14.  -Continue home Norvasc.  5. Constipation. Reported at  time of presentation, but no further complaints since then. -May have been secondary to narcotics vs pain. -Continue Miralax and Colace BID, BM reported yesterday.  FEN/GI: regular diet (low tyramine due to therapy with selegiline), saline lock IV Prophylaxis: Lovenox sq  Disposition: Management as above with pain control; DC today to [facility]. CSW assistance appreciated! Code: Patient desires DNR; out-of-facility DNR form placed on physical chart with Rx's  Claudis Giovanelli, Cristal Deer, MD 10/06/2012, 9:13 AM

## 2012-10-06 NOTE — Progress Notes (Signed)
FMTS Attending Daily Note:  Renold Don MD  [phone] pager  Family Practice pager:  [phone] I have seen and examined this patient and have reviewed their chart. I have discussed this patient with the resident. I agree with the resident's findings, assessment and care plan.  DC [facility].

## 2012-10-06 NOTE — Discharge Summary (Signed)
Family Medicine Teaching Service  Discharge Note : Attending Jeff Lei Dower MD Pager [phone] Inpatient Team Pager:  [phone]  I have seen and examined this patient, reviewed their chart and discussed discharge planning with the resident at the time of discharge. I agree with the discharge plan as above.  

## 2012-10-06 NOTE — Telephone Encounter (Addendum)
Received call from [facility] pharmacy on [address] reporting a level I drug interaction between tramadol that was prescribed yesterday and MAY inhibitor that patient is on.  These meds should not be despensed together. Paged Dr. Casper Harrison  . Call back number to pharmacy is [phone]

## 2012-10-06 NOTE — Telephone Encounter (Signed)
Dr. Ermalinda Memos returned page. They will take care of this issue.

## 2012-10-06 NOTE — Progress Notes (Signed)
PGY-1 Update Note  Spoke with pharmacy regarding Ultram interaction with selegiline; risk for increased BP and serotonin syndrome. Concern was for confusion/sedation with Norco/morphine. Will discontinue Ultram and use reduced dose of Norco for pain control, along with Tylenol. Otherwise plan to DC to [facility] today, as noted previously today.  Bobbye Morton, MD PGY-1, [facility] Family Medicine FPTS Intern pager: [phone]

## 2012-10-06 NOTE — Progress Notes (Signed)
Clinical social worker assisted with patient discharge to skilled nursing facility, [facility].  CSW addressed all family questions and concerns. CSW copied chart and added all important documents. Patient will be transported by private vehicle Clinical Social Worker will sign off for now as social work intervention is no longer needed.   Sabino Niemann, MSW, [employer] [phone]

## 2012-10-07 ENCOUNTER — Telehealth: Payer: Self-pay | Admitting: Family Medicine

## 2012-10-07 LAB — URINE CULTURE: Colony Count: >=100000

## 2012-10-07 NOTE — Telephone Encounter (Signed)
Entered in error

## 2012-10-08 ENCOUNTER — Telehealth: Payer: Self-pay | Admitting: Family Medicine

## 2012-10-08 NOTE — Telephone Encounter (Signed)
Called [facility] earlier today and told them about results of urine culture. Spoke with Lottie who works at the [employer] side of [facility]. She asked for [place] to fax Rx.  Faxed Rx and urine culture results with sensitivities to both Lottie at [phone] (fax) and the supervisor: Ladora [phone] (fax).   Also spoke with patient's wife this morning at 8am and let her know of the urine culture results. She reported that he had been having some mild discomfort with urination, but no abdominal pain, no fever, no nausea or vomiting.

## 2012-10-09 ENCOUNTER — Telehealth: Payer: Self-pay | Admitting: Family Medicine

## 2012-10-09 NOTE — Telephone Encounter (Signed)
Edit: Encounter created in error. See previous telephone calls, re: abx faxed to [facility], discussed with wife by Dr. Gwenlyn Saran.

## 2012-10-09 NOTE — Telephone Encounter (Signed)
Message copied by Bobbye Morton on Mon Oct 09, 2012  4:29 PM ------      Message from: Gwendolyn Grant, JEFFREY H      Created: Mon Oct 09, 2012  3:15 PM      Regarding: RE: [facility] urine cultures       Was this addressed?  Want to remove the results from my inbox.            Thanks, Trey Paula      ----- Message -----         From: Tobey Grim, MD         Sent: 10/07/2012  11:48 AM           To: Tobey Grim, MD, Durwin Reges, MD, #      Subject: Poblano's urine cultures                                 Hey guys, both Latravis Thelma Barge and Clemmie Krill urine cultures came back >100K of a single organism.  Will you guys make sure this is addressed in an addendum to the DC summary as well as any treatment you might want?              Thanks,            Trey Paula

## 2012-10-18 NOTE — ED Provider Notes (Cosign Needed)
History     CSN: [id number]  Arrival date & time 09/29/12  1647   First MD Initiated Contact with Patient 09/29/12 1736      Chief Complaint  Patient presents with  . Back Pain    radiates to right hip and leg    (Consider location/radiation/quality/duration/timing/severity/associated sxs/prior treatment) Patient is a 78 y.o. male presenting with back pain. The history is provided by the patient and the spouse. No language interpreter was used.  Back Pain  This is a new problem. The current episode started 1 to 2 hours ago. The problem occurs constantly. The problem has not changed since onset.Associated with: lawn mower accident. The quality of the pain is described as aching and shooting. The pain radiates to the right thigh and right knee. The pain is at a severity of 6/10. The pain is moderate. The symptoms are aggravated by certain positions. The pain is the same all the time. Associated symptoms include leg pain. Pertinent negatives include no fever, no numbness, no bowel incontinence, no perianal numbness, no pelvic pain, no paresthesias, no paresis and no weakness. He has tried nothing for the symptoms.  78 yo male with R hip and femur pain after riding lawn mower accident.  Patient was mowing on a hill and slid off of the seat landing on his R hip.  Unable to ambulate.  Denies hitting head.  The pain is 8/10 especially with movement.  Radiates from R hip to RLE.  No deformity noted, No shortening or rotation of the extremity.  HIs orthopedic physician is Dr. Lajoyce Corners.     Past Medical History  Diagnosis Date  . Parkinson's disease   . Hypertension     Past Surgical History  Procedure Date  . Total knee arthroplasty     bilateral  . Hernia repair 7 years ago    History reviewed. No pertinent family history.  History  Substance Use Topics  . Smoking status: Current Every Day Smoker    Types: Cigars  . Smokeless tobacco: Not on file  . Alcohol Use: 0.6 oz/week    1 Glasses  of wine per week     Comment: 1 glass of wine at night      Review of Systems  Constitutional: Negative.  Negative for fever.  HENT: Negative.   Eyes: Negative.   Respiratory: Negative.   Cardiovascular: Negative.   Gastrointestinal: Negative.  Negative for nausea, vomiting and bowel incontinence.  Genitourinary: Negative for pelvic pain.  Musculoskeletal: Positive for gait problem. Negative for joint swelling.       R hip/femur pain with movement.    Neurological: Negative.  Negative for weakness, numbness and paresthesias.  Psychiatric/Behavioral: Negative.   All other systems reviewed and are negative.    Allergies  Aspirin adult low and Ibuprofen  Home Medications   Current Outpatient Rx  Name  Route  Sig  Dispense  Refill  . ACETAMINOPHEN 325 MG PO TABS   Oral   Take 650 mg by mouth at bedtime as needed. For pain         . AMLODIPINE BESYLATE 5 MG PO TABS   Oral   Take 5 mg by mouth daily.         Marland Kitchen CARBIDOPA-LEVODOPA 25-100 MG PO TBDP   Oral   Take 1.5 tablets by mouth 3 (three) times daily.          Marland Kitchen SELEGILINE HCL 5 MG PO CAPS   Oral   Take 5 mg  by mouth daily.          Marland Kitchen DICLOFENAC SODIUM 1 % TD GEL   Topical   Apply 2-4 g topically 4 (four) times daily as needed. For arthritis pain         . DSS 100 MG PO CAPS   Oral   Take 100 mg by mouth 2 (two) times daily as needed for constipation.   10 capsule      . HYDROCODONE-ACETAMINOPHEN 5-325 MG PO TABS   Oral   Take 0.5 tablets by mouth every 6 (six) hours as needed for pain.   30 tablet   0   . OXYCODONE-ACETAMINOPHEN 5-325 MG PO TABS   Oral   Take 0.5-1 tablets by mouth every 4 (four) hours as needed. For pain   30 tablet   0   . POLYETHYLENE GLYCOL 3350 PO PACK   Oral   Take 17 g by mouth 2 (two) times daily as needed (for constipation).   14 each        BP 187/91  Pulse 81  Temp 97.6 F (36.4 C) (Oral)  Resp 16  SpO2 94%  Physical Exam  Nursing note and vitals  reviewed. Constitutional: He is oriented to person, place, and time. He appears well-developed and well-nourished.  HENT:  Head: Normocephalic.  Eyes: Conjunctivae normal and EOM are normal. Pupils are equal, round, and reactive to light.  Neck: Normal range of motion. Neck supple.  Cardiovascular: Normal rate.   Pulmonary/Chest: Effort normal.  Abdominal: Soft.  Musculoskeletal:       Pain with movement of the R hip No internal rotation or shortening of the extremity.  + CMS below injury  Neurological: He is alert and oriented to person, place, and time.  Skin: Skin is warm and dry.  Psychiatric: He has a normal mood and affect.    ED Course  Procedures (including critical care time)   Spinal immobilization removed.  No point tenderness to spine. Neuro in tact.  Denies hitting head.     Dr. Weldon Inches in to see patient.    Labs Reviewed - No data to display No results found.   1. Fracture of femur, trochanteric       MDM  Mild displaced avulsed fracture of the superior aspect right greater femoral trochanter reviewed by myself.  Ortho consulted and no surgery required.  Will follow up with Dr. Lajoyce Corners this week.  Narcotic pain meds provided. P tar to transport home.  Patient and wife agree with plan and are ready for discharge.            Remi Haggard, NP 10/18/12 1358

## 2012-12-29 ENCOUNTER — Other Ambulatory Visit: Payer: Self-pay | Admitting: Neurology

## 2012-12-29 DIAGNOSIS — R269 Unspecified abnormalities of gait and mobility: Secondary | ICD-10-CM

## 2013-01-03 ENCOUNTER — Ambulatory Visit
Admission: RE | Admit: 2013-01-03 | Discharge: 2013-01-03 | Disposition: A | Discharge status: Home / Self Care | Payer: Medicare Other | Source: Ambulatory Visit | Attending: Neurology | Admitting: Neurology

## 2013-01-03 DIAGNOSIS — R269 Unspecified abnormalities of gait and mobility: Secondary | ICD-10-CM

## 2013-03-12 ENCOUNTER — Other Ambulatory Visit: Payer: Self-pay

## 2013-03-12 ENCOUNTER — Encounter: Payer: Self-pay | Admitting: Vascular Surgery

## 2013-03-12 DIAGNOSIS — I714 Abdominal aortic aneurysm, without rupture: Secondary | ICD-10-CM

## 2013-03-26 ENCOUNTER — Encounter: Payer: Self-pay | Admitting: Vascular Surgery

## 2013-03-27 ENCOUNTER — Ambulatory Visit (INDEPENDENT_AMBULATORY_CARE_PROVIDER_SITE_OTHER): Payer: Medicare Other | Admitting: Vascular Surgery

## 2013-03-27 ENCOUNTER — Encounter: Payer: Self-pay | Admitting: Vascular Surgery

## 2013-03-27 ENCOUNTER — Encounter (INDEPENDENT_AMBULATORY_CARE_PROVIDER_SITE_OTHER): Payer: Medicare Other

## 2013-03-27 VITALS — BP 199/109 | HR 65 | Resp 18 | Ht 72.0 in | Wt 210.9 lb

## 2013-03-27 DIAGNOSIS — I714 Abdominal aortic aneurysm, without rupture: Secondary | ICD-10-CM | POA: Insufficient documentation

## 2013-03-27 NOTE — Addendum Note (Signed)
Addended by: Sharee Pimple on: 03/27/2013 01:40 PM   Modules accepted: Orders

## 2013-03-27 NOTE — Progress Notes (Signed)
Subjective:     Patient ID: Jose Vega, male   DOB: Apr 18, 1934, 79 y.o.   MRN: [id number]  HPI this 79 year old male was referred by Dr. Aldean Baker for evaluation and treatment of abdominal aortic aneurysm. This was discovered the patient was getting some lumbar spine x-rays. He has spondylolisthesis and degenerative disc disease as well as right rotator cuff problem. He recently had epidural steroid injections for the second time with some improvement. He had no previous knowledge of aneurysm. He does have a history of possibly early Parkinson's with some shuffling gait and has been followed by Dr. Tamala Ser.  Past Medical History  Diagnosis Date  . Parkinson's disease   . Hypertension   . AAA (abdominal aortic aneurysm)   . Spondylolisthesis   . Lumbar degenerative disc disease     History  Substance Use Topics  . Smoking status: Current Every Day Smoker    Types: Cigars  . Smokeless tobacco: Never Used  . Alcohol Use: 0.6 oz/week    1 Glasses of wine per week     Comment: 1 glass of wine at night    Family History  Problem Relation Age of Onset  . Diabetes Father   . Diabetes Brother     Allergies  Allergen Reactions  . Aspirin Adult Low (Aspirin) Other (See Comments)    GI BLEED/ulcers  . Ibuprofen     Irritates stomach    Current outpatient prescriptions:acetaminophen (TYLENOL) 325 MG tablet, Take 650 mg by mouth at bedtime as needed. For pain, Disp: , Rfl: ;  amLODipine (NORVASC) 5 MG tablet, Take 5 mg by mouth daily., Disp: , Rfl: ;  carbidopa-levodopa (PARCOPA) 25-100 MG per disintegrating tablet, Take 1.5 tablets by mouth 3 (three) times daily. , Disp: , Rfl:  diclofenac sodium (VOLTAREN) 1 % GEL, Apply 2-4 g topically 4 (four) times daily as needed. For arthritis pain, Disp: , Rfl: ;  docusate sodium 100 MG CAPS, Take 100 mg by mouth 2 (two) times daily as needed for constipation., Disp: 10 capsule, Rfl: ;  HYDROcodone-acetaminophen (NORCO/VICODIN) 5-325 MG per  tablet, Take 0.5 tablets by mouth every 6 (six) hours as needed for pain., Disp: 30 tablet, Rfl: 0 oxyCODONE-acetaminophen (PERCOCET/ROXICET) 5-325 MG per tablet, Take 0.5-1 tablets by mouth every 4 (four) hours as needed. For pain, Disp: 30 tablet, Rfl: 0;  polyethylene glycol (MIRALAX / GLYCOLAX) packet, Take 17 g by mouth 2 (two) times daily as needed (for constipation)., Disp: 14 each, Rfl: ;  selegiline (ELDEPRYL) 5 MG capsule, Take 5 mg by mouth daily. , Disp: , Rfl:   BP 199/109  Pulse 65  Resp 18  Ht 6' (1.829 m)  Wt 210 lb 14.4 oz (95.664 kg)  BMI 28.6 kg/m2  Body mass index is 28.6 kg/(m^2).           Review of Systems complains of pain in legs with walking, pain-free with lying flat, swelling in legs, varicose veins left leg, weakness in arms and legs, numbness in arms and legs     Objective:   Physical Exam blood pressure 199/109 heart rate 65 respirations 18 Gen.-alert and oriented x3 in no apparent distress-flat affect  HEENT normal for age Lungs no rhonchi or wheezing Cardiovascular regular rhythm no murmurs carotid pulses 3+ palpable no bruits audible Abdomen soft nontender no palpable masses Musculoskeletal free of  major deformities Skin clear -no rashes Neurologic normal Lower extremities 3+ femoral and dorsalis pedis pulses palpable bilaterally with 1+ edema. Varicose  veins and distal left medial thigh and great saphenous system  Today I ordered a duplex scan of his abdominal aorta which I reviewed and interpreted. He does have an infrarenal abdominal aortic aneurysm with a maximum diameter of 4.9 cm x 4.6 cm       Assessment:     AAA-4.9 x 4.6 by duplex scan    Plan:     Return in 6 months with CT angiogram to see if patient is stent graft candidate and check dimensions of aneurysm

## 2013-04-17 ENCOUNTER — Telehealth: Payer: Self-pay | Admitting: Neurology

## 2013-05-08 ENCOUNTER — Encounter: Payer: Self-pay | Admitting: Neurology

## 2013-05-23 ENCOUNTER — Ambulatory Visit (INDEPENDENT_AMBULATORY_CARE_PROVIDER_SITE_OTHER): Payer: Medicare Other | Admitting: Neurology

## 2013-05-23 ENCOUNTER — Encounter: Payer: Self-pay | Admitting: Neurology

## 2013-05-23 VITALS — BP 143/83 | HR 71 | Temp 97.2°F | Ht 71.0 in | Wt 212.0 lb

## 2013-05-23 DIAGNOSIS — R0609 Other forms of dyspnea: Secondary | ICD-10-CM

## 2013-05-23 DIAGNOSIS — R269 Unspecified abnormalities of gait and mobility: Secondary | ICD-10-CM

## 2013-05-23 DIAGNOSIS — R400 Somnolence: Secondary | ICD-10-CM

## 2013-05-23 DIAGNOSIS — R0683 Snoring: Secondary | ICD-10-CM

## 2013-05-23 DIAGNOSIS — G20 Parkinson's disease: Secondary | ICD-10-CM

## 2013-05-23 DIAGNOSIS — M479 Spondylosis, unspecified: Secondary | ICD-10-CM

## 2013-05-23 DIAGNOSIS — R404 Transient alteration of awareness: Secondary | ICD-10-CM

## 2013-05-23 MED ORDER — RASAGILINE MESYLATE 1 MG PO TABS: ORAL_TABLET | ORAL | Status: DC

## 2013-05-23 NOTE — Progress Notes (Signed)
Subjective:    Patient ID: Jose Vega is a 79 y.o. male.  HPI   Interim history:  Jose Vega is a very pleasant 79 year old right-handed gentleman with an underlying medical history of hypertension, memory loss, vitamin B12 deficiency, peripheral neuropathy and gait disorder, who presents for followup consultation of his Parkinsonism. He is accompanied by his wife today. This is his first visit with me and he previously followed with Jose Vega love and was last seen by him on 12/28/2012, at which time Jose Vega felt that his gait was worse. He tapered him off of selegiline and carbidopa-levodopa and suggested reevaluation of his gait as well as rechecking his B12 level, methylmalonic acid, and lumbar spine MRI. He's currently on B12 injections once monthly, amlodipine, Tylenol as needed, Celexa.   I reviewed Jose Vega prior notes and the patient's records and below is a summary of that review:  79 year old right-handed gentleman with a 5+ year history of low volume voice, difficulty getting his words out, memory loss, slowness and moving and drooling. He has a family history of Parkinson's disease in his mother. There is no history of head or neck trauma. He self Jose Vega for the first time in October 2010 and was on Sinemet for suspected Parkinson's disease. At the time he had evidence of peripheral neuropathy and memory loss with an MMSE score of 25. Celexa was added for mood changes. He has had a progressive gait disorder and has had recurrent falls. MRI of brain and C-spine in July 2010 showed age-appropriate small vessel disease and atrophy and spondylitic changes without disc herniation or compression of the spinal cord. T- spine MRI in November 2012 showed mild degenerative joint disease without cord compression. Blood work included serum protein electrophoresis, TSH, RPR, vitamin B12, serum methylmalonic acid, all normal with the exception of low vitamin B12 for which she has been on B12  injections. Dr. love asked him to stop driving. In August 2013 he fell and fractured his right hip. He did not have hip surgery. In February 2014 his MMSE was 29, clock drawing was 4, animal fluency was 16. He had L TKA in 1998 and R partial knee replacement in 2006.   He had a L spine MRI on 01/03/13: chronic anterolisthesis at L4-5 with  moderate to severe central canal and lateral recess and foraminal narrowing bilaterally. There is  also  anterior endplate compression deformity at L3. There is mild foraminal stenosis at L1--L2   and  L3-L4 on the left .Overall the degenerative changes appear to be unchanged compared with previous MRI scan dated 06/03/2012. The  study is limited by motion artifacts. Jose Vega called them a few days later and left a message re: the test results.   Since coming off of Selegiline and Levodopa, he has not noted much in the way of difference in his gait. The only change recently is drooling in the last few months. His voice has become just a little bit softer and he slurs his speech at times.  His wife reports severe EDS and he takes multiple naps during the day and has fallen asleep while eating. He does snore.  He is in outpt PT. He was in inpt rehab in the recent past for R shoulder injury.   His Past Medical History Is Significant For: Past Medical History  Diagnosis Date  . Parkinson's disease   . Hypertension   . AAA (abdominal aortic aneurysm)   . Spondylolisthesis   . Lumbar  degenerative disc disease     His Past Surgical History Is Significant For: Past Surgical History  Procedure Laterality Date  . Total knee arthroplasty      bilateral  . Hernia repair  7 years ago    His Family History Is Significant For: Family History  Problem Relation Age of Onset  . Diabetes Father   . Diabetes Brother     His Social History Is Significant For: History   Social History  . Marital Status: Married    Spouse Name: N/A    Number of Children: N/A  .  Years of Education: N/A   Social History Main Topics  . Smoking status: Current Every Day Smoker    Types: Cigars  . Smokeless tobacco: Never Used  . Alcohol Use: 0.6 oz/week    1 Glasses of wine per week     Comment: 1 glass of wine at night  . Drug Use: No  . Sexually Active: None   Other Topics Concern  . None   Social History Narrative  . None    His Allergies Are:  Allergies  Allergen Reactions  . Aspirin Adult Low (Aspirin) Other (See Comments)    GI BLEED/ulcers  . Ibuprofen     Irritates stomach  :   His Current Medications Are:  Outpatient Encounter Prescriptions as of 05/23/2013  Medication Sig Dispense Refill  . acetaminophen (TYLENOL) 325 MG tablet Take 650 mg by mouth at bedtime as needed. For pain      . amLODipine (NORVASC) 5 MG tablet Take 5 mg by mouth daily.      . diclofenac sodium (VOLTAREN) 1 % GEL Apply 2-4 g topically 4 (four) times daily as needed. For arthritis pain      . docusate sodium 100 MG CAPS Take 100 mg by mouth 2 (two) times daily as needed for constipation.  10 capsule    . HYDROcodone-acetaminophen (NORCO/VICODIN) 5-325 MG per tablet Take 0.5 tablets by mouth every 6 (six) hours as needed for pain.  30 tablet  0  . oxyCODONE-acetaminophen (PERCOCET/ROXICET) 5-325 MG per tablet Take 0.5-1 tablets by mouth every 4 (four) hours as needed. For pain  30 tablet  0  . polyethylene glycol (MIRALAX / GLYCOLAX) packet Take 17 g by mouth 2 (two) times daily as needed (for constipation).  14 each    . rasagiline (AZILECT) 1 MG TABS 1/2 pill daily in AM x 2 weeks, then 1 pill once daily in AM thereafter.  30 tablet  5  . [DISCONTINUED] carbidopa-levodopa (PARCOPA) 25-100 MG per disintegrating tablet Take 1.5 tablets by mouth 3 (three) times daily.       . [DISCONTINUED] citalopram (CELEXA) 20 MG tablet       . [DISCONTINUED] selegiline (ELDEPRYL) 5 MG capsule Take 5 mg by mouth daily.        No facility-administered encounter medications on file as of  05/23/2013.   Review of Systems  Respiratory:       Snoring  Cardiovascular: Positive for leg swelling.  Musculoskeletal: Positive for arthralgias.  Neurological: Positive for speech difficulty.    Objective:  Neurologic Exam  Physical Exam Physical Examination:   Filed Vitals:   05/23/13 0856  BP: 143/83  Pulse: 71  Temp: 97.2 F (36.2 C)    General Examination: The patient is a very pleasant 79 y.o. male in no acute distress.  HEENT: Normocephalic, atraumatic, pupils are equal, round and reactive to light and accommodation. Funduscopic exam is normal with  sharp disc margins noted. Extraocular tracking shows mild saccadic breakdown without nystagmus noted. There is limitation to upper gaze. There is mild decrease in eye blink rate. Hearing is intact. Tympanic membranes are clear bilaterally. Face is symmetric with minimal facial masking and normal facial sensation. There is no lip, neck or jaw tremor. Neck is moderately rigid with intact passive ROM. There are no carotid bruits on auscultation. Oropharynx exam reveals mild mouth dryness. No significant airway crowding is noted. Mallampati is class I. Tongue protrudes centrally and palate elevates symmetrically.   There is moderate drooling.   Chest: is clear to auscultation without wheezing, rhonchi or crackles noted.  Heart: sounds are regular and normal without murmurs, rubs or gallops noted.   Abdomen: is soft, non-tender and non-distended with normal bowel sounds appreciated on auscultation.  Extremities: There is no pitting edema in the distal lower extremities bilaterally. Pedal pulses are intact.   Skin: is warm and dry with no trophic changes noted. Age-related changes are noted on the skin.   Musculoskeletal: exam reveals: lumbar kyphosis and significant decrease in ROM of the R shoulder. He has L hip discomfort.  Neurologically:  Mental status: The patient is awake and alert, paying good  attention. He is able to  partially provide the history. His wife provides details. He is oriented to: person, place, time/date, situation, day of week, month of year and year. His memory, attention, language and knowledge are fairly intact. There is no aphasia, agnosia, apraxia or anomia. There is a mild degree of bradyphrenia. Speech is mildly hypophonic with mild dysarthria noted. Mood is congruent and affect is normal.  Cranial nerves are as described above under HEENT exam. In addition, shoulder shrug is normal with equal shoulder height noted.  Motor exam: Normal bulk, and strength for age is noted. There are no dyskinesias noted.   Tone is mildly rigid in the RUE, with absence of cogwheeling. There is overall mild bradykinesia. There is no drift or rebound.  There is no tremor.    Reflexes are 1+ in the upper extremities and none in the lower extremities.   Fine motor skills exam: Finger taps are mildly impaired on the right and mildly impaired on the left. Hand movements are mildly impaired on the right and mildly impaired on the left. RAP (rapid alternating patting) is mildly impaired on the right and mildly impaired on the left. Foot taps are moderately impaired on the right and moderately impaired on the left. Foot agility (in the form of heel stomping) is moderately impaired on the right and moderately impaired on the left.    Cerebellar testing shows no dysmetria or intention tremor on finger to nose testing. There is no truncal or gait ataxia.   Sensory exam is intact to light touch.   Gait, station and balance: He stands up from the seated position with severe difficulty and does need to push up with His hands. He needs little assistance. No veering to one side is noted. He is not noted to lean to the side. Posture is moderately stooped. Stance is wide-based. He walks with decrease in stride length and pace and has to rely on his wheeled walker. He has some start hesitation and does not pick up his feet very well.  He turns and 5 steps and has difficulty turning. There is one or 2 short episodes of freezing. Tandem walk is not possible. Balance is moderately impaired. He is not able to do a toe or heel stance.  Assessment and Plan:   Assessment and Plan:  In summary, Nadav A Luzier is a very pleasant 79 y.o.-year old male with a history of gait dysfunction, degenerative spine disease especially lower back disease, and some signs of parkinsonism. While I doubt that he has idiopathic Parkinson's disease he does exhibit some Parkinsonian features including drooling, mild masked facies, hypophonia, and his gait disorder may be more of a multifactorial problem. Certainly, his gait dysfunction is more pronounced than his other Parkinsonian findings at this time. He had no significant favorable response with selegiline or levodopa. Again, this speaks against idiopathic Parkinson's disease. I explained this to the patient and his wife in detail today. Given his history of significant daytime somnolence with multiple naps during the day as well as falling asleep with activity such as eating in the context of nighttime snoring and I do want to proceed with a sleep study at this time. We talked a little bit about obstructive sleep apnea and how it may affect daytime functioning and cardiovascular health. I explained to them sleep study as well as treatment options including CPAP use. Medication wise, I suggest a trial of asthma like to at this time. We went over common side effects as well. He will start with half a pill a bring this up to a whole pill after 2 weeks. I will see him back in about 3 months, sooner if the need arises. If his sleep study is positive for sleep apnea we may need to see him sooner than 3 months as well. They were in agreement with the plan. They are encouraged to call with any interim questions, concerns, problems or updates and refill requests and/or test results.

## 2013-05-23 NOTE — Patient Instructions (Addendum)
I think overall you are doing fairly well but I do want to suggest a few things today:  Remember to drink plenty of fluid, eat healthy meals and do not skip any meals. Try to eat protein with a every meal and eat a healthy snack such as fruit or nuts in between meals. Try to keep a regular sleep-wake schedule and try to exercise daily, particularly in the form of walking, 20-30 minutes a day, if you can.   Engage in social activities in your community and with your family and try to keep up with current events by reading the newspaper or watching the news.   As far as your medications are concerned, I would like to suggest: Azilect 1 mg strength: take 1/2 pill each morning for 2 weeks, then 1 whole pill each morning thereafter. Common side effects reported or headaches, nausea, diarrhea, or constipation. Rare side effects can be confusion, personality changes, hallucinations.  As far as diagnostic testing: sleep study.  I would like to see you back in 3 months, sooner if we need to. Please call us with any interim questions, concerns, problems, updates or refill requests.  Please also call us for any test results so we can go over those with you on the phone. Brett Canales is my clinical assistant and will answer any of your questions and relay your messages to me and also relay most of my messages to you.  Our phone number is [phone]. We also have an after hours call service for urgent matters and there is a physician on-call for urgent questions. For any emergencies you know to call 911 or go to the nearest emergency room.

## 2013-06-12 ENCOUNTER — Ambulatory Visit (INDEPENDENT_AMBULATORY_CARE_PROVIDER_SITE_OTHER): Payer: Medicare Other | Admitting: Neurology

## 2013-06-12 DIAGNOSIS — R0609 Other forms of dyspnea: Secondary | ICD-10-CM

## 2013-06-12 DIAGNOSIS — IMO0002 Reserved for concepts with insufficient information to code with codable children: Secondary | ICD-10-CM

## 2013-06-12 DIAGNOSIS — R0683 Snoring: Secondary | ICD-10-CM

## 2013-06-12 DIAGNOSIS — R400 Somnolence: Secondary | ICD-10-CM

## 2013-06-12 DIAGNOSIS — G4761 Periodic limb movement disorder: Secondary | ICD-10-CM

## 2013-06-12 DIAGNOSIS — G20C Parkinsonism, unspecified: Secondary | ICD-10-CM

## 2013-06-12 DIAGNOSIS — G20 Parkinson's disease: Secondary | ICD-10-CM

## 2013-06-26 ENCOUNTER — Telehealth: Payer: Self-pay

## 2013-06-26 NOTE — Telephone Encounter (Signed)
[facility] called, left message stating the patient is now taking Celexa from her PCP.  We are prescribing Azilect and they would like to know if the Azilect, and there is a potential drug interaction and they cannot refill the Aziect without documentation form [place] saying it is okay for the patient to take both meds..  I called them back.  Spoke with [employer].  Asked her if the Celexa was a new Rx, she said it was not.  Says the patient has been on it for quite some time, the Azilect is newer.  Possible Level 1 interaction noted.  Combo of these drugs may increase risk of serotonin syndrome, neuroleptic malignant syndrome.  Dr Frances Furbish, okay to continue filling Azilect while patient is taking Celexa? Please advise.

## 2013-06-26 NOTE — Telephone Encounter (Signed)
I called the pharmacy back.  Spoke with [employer].  Advised her MD approved filling medication.  They will go over medications when patient is in to pick them up.  She will note the file.  I called the patient back, got no answer.  Left message.

## 2013-06-26 NOTE — Telephone Encounter (Signed)
Jessica: please call pharmacy back and report and I have reviewed the interaction profile with Celexa and Azilect and it is okay to go ahead and fill Azilect prescription despite the Celexa. Please call patient as well and advised that I have recommended starting a new medication called Azilect like we have discussed. We had discussed side effect profile during her visit but just to re-iterate: please have patient look out for increased blood pressure, increased headache, flushing, muscle stiffness, sedation or confusion. The risk of side effect is a little higher when taken with the Celexa. Please make sure the patient does not take Celexa and Azelect at the very same time. For example the Azelect maybe just in the morning and the Celexa may be dose to the evening, or vice versa.

## 2013-07-04 ENCOUNTER — Telehealth: Payer: Self-pay | Admitting: Neurology

## 2013-07-04 NOTE — Telephone Encounter (Signed)
Please call and notify the patient that the recent sleep study did not show any significant obstructive sleep apnea, however, there was a low baseline O2 saturation and he had saturations below 90% during sleep for over one hour for the entire study. I would really like to have him see a pulmonologist for at least one visit to look for any lung related reasons for his lower oxygen values. This may have something to do with his narcotic pain medication, which may lower his ability to breathe effectively in sleep. He also has significant periodic leg movements of sleep but they do not seem to cause much in the way of sleep disruption. Unless he has significant restless legs syndrome type symptoms we may not have to address this with in any other way of fashion. We will go over test results at his next appointment and it should already be in the system. I will request a pulm consult. Also, route or fax report to PCP and referring MD, if other than PCP.  Once you have spoken to patient, you can close this encounter.   Thanks,  Huston Foley, MD, PhD [facility])

## 2013-07-05 NOTE — Telephone Encounter (Signed)
Called patient to discuss sleep study results.  Discussed findings, recommendations and follow up care.  Patient understood well and all questions were answered.   Because patient isn't scheduled to meet with Dr. Frances Furbish again until December, a follow up appointment was scheduled for 07/10/13 at 10:30 PM to discuss results and referral to a pulmonary MD. Copy of report mailed to patient's home.

## 2013-07-10 ENCOUNTER — Ambulatory Visit (INDEPENDENT_AMBULATORY_CARE_PROVIDER_SITE_OTHER): Payer: Medicare Other | Admitting: Neurology

## 2013-07-10 ENCOUNTER — Encounter: Payer: Self-pay | Admitting: Neurology

## 2013-07-10 VITALS — BP 133/85 | HR 61 | Temp 97.0°F | Ht 69.0 in | Wt 203.0 lb

## 2013-07-10 DIAGNOSIS — G2581 Restless legs syndrome: Secondary | ICD-10-CM

## 2013-07-10 DIAGNOSIS — M479 Spondylosis, unspecified: Secondary | ICD-10-CM

## 2013-07-10 DIAGNOSIS — G4734 Idiopathic sleep related nonobstructive alveolar hypoventilation: Secondary | ICD-10-CM

## 2013-07-10 DIAGNOSIS — R269 Unspecified abnormalities of gait and mobility: Secondary | ICD-10-CM

## 2013-07-10 DIAGNOSIS — G20 Parkinson's disease: Secondary | ICD-10-CM

## 2013-07-10 DIAGNOSIS — R0902 Hypoxemia: Secondary | ICD-10-CM

## 2013-07-10 NOTE — Progress Notes (Signed)
Subjective:    Patient ID: Jose Vega is a 79 y.o. male.  HPI  Interim history:   Jose Vega is a very pleasant 79 year old right-handed gentleman with an underlying medical history of hypertension, memory loss, vitamin B12 deficiency, peripheral neuropathy and gait disorder, who presents for followup consultation after a recent sleep study. He is accompanied by his wife again today. I first met him on 05/23/2013 for followup consultation of his Parkinsonism. He previously followed with Dr. Fayrene Fearing love and was last seen by him on 12/28/2012, at which time Dr. Sandria Manly felt that his gait was worse. He tapered him off of selegiline and carbidopa-levodopa and suggested reevaluation of his gait as well as rechecking his B12 level, methylmalonic acid, and lumbar spine MRI. He used to have B12 injections once monthly, but is now on oral supplements, amlodipine, Tylenol as needed, Celexa.  He has a 5+ year history of low volume voice, difficulty getting his words out, memory loss, slowness and moving and drooling. He has a family history of Parkinson's disease in his mother. There is no history of head or neck trauma. He saw Dr. Sandria Manly for the first time in October 2010 and was on Sinemet for suspected Parkinson's disease. At the time he had evidence of peripheral neuropathy and memory loss with an MMSE score of 25. Celexa was added for mood changes. He has had a progressive gait disorder and has had recurrent falls. MRI of brain and C-spine in July 2010 showed age-appropriate small vessel disease and atrophy and spondylitic changes without disc herniation or compression of the spinal cord. T. spine MRI in November 2012 showed mild degenerative joint disease without cord compression. Blood work included serum protein electrophoresis, TSH, RPR, vitamin B12, serum methylmalonic acid, all normal with the exception of low vitamin B12 for which she has been on B12 injections. Dr. love asked him to stop driving. In August  2013 he fell and fractured his right hip. He did not have hip surgery. In February 2014 his MMSE was 29, clock drawing was 4, animal fluency was 16. He had a diagnostic sleep study on 06/12/2013 and I went over his test results with him and his wife in detail today. His sleep efficiency was reduced at 47.5% with a latency to sleep of 61 minutes and wake after sleep onset at 194 minutes with mild sleep fragmentation noted that several longer periods of wakefulness. His arousal index was normal at 4.4 arousals per hour. He had a normal percentage of stage I sleep, a markedly increased percentage of stage II sleep, absence of slow-wave sleep and a decreased percentage of REM sleep at 12.6%. REM latency was normal. He had severe periodic leg movements at 123.4 per hour and an associated arousal index of 3.4 per hour. He had mild intermittent snoring and a total of 1 obstructive apnea as well as 9 obstructive hypopneas, rendering A. overall normal AHI of 2.9 per hour, rising to 4.1 per hour and REM sleep. His baseline oxygen saturation was only 91% with a nadir of 82%. He spent 23 and minutes below the saturation of 88% for the night. He spent 1 hour and 12 minutes below the saturation of 90% for the night.  He denies history of lung disease and takes his pain medicine very infrequently. He has mild RLS Sx, but they are not very bothersome. He kicks in his sleep, but not every night and mostly after more than usual physical exertion. The Azilect did not make a  difference in his symptoms.  His Past Medical History Is Significant For: Past Medical History  Diagnosis Date  . Parkinson's disease   . Hypertension   . AAA (abdominal aortic aneurysm)   . Spondylolisthesis   . Lumbar degenerative disc disease     His Past Surgical History Is Significant For: Past Surgical History  Procedure Laterality Date  . Total knee arthroplasty      bilateral  . Hernia repair  7 years ago    His Family History Is  Significant For: Family History  Problem Relation Age of Onset  . Diabetes Father   . Diabetes Brother   . Cancer Brother   . Parkinsonism Mother     His Social History Is Significant For: History   Social History  . Marital Status: Married    Spouse Name: Bonita Quin    Number of Children: 3  . Years of Education: College   Occupational History  . Retired    Social History Main Topics  . Smoking status: Current Every Day Smoker    Types: Cigars  . Smokeless tobacco: Never Used     Comment: 2-3 cigars daily  . Alcohol Use: 0.6 oz/week    1 Glasses of wine per week     Comment: 1 glass of wine at night  . Drug Use: No  . Sexual Activity: None   Other Topics Concern  . None   Social History Narrative   Patient lives at home with his spouse.   Caffeine Use: coffee and tea daily    His Allergies Are:  Allergies  Allergen Reactions  . Aspirin Adult Low [Aspirin] Other (See Comments)    GI BLEED/ulcers  . Ibuprofen     Irritates stomach  :   His Current Medications Are:  Outpatient Encounter Prescriptions as of 07/10/2013  Medication Sig Dispense Refill  . acetaminophen (TYLENOL) 325 MG tablet Take 650 mg by mouth at bedtime as needed. For pain      . amLODipine (NORVASC) 5 MG tablet Take 5 mg by mouth daily.      . carbidopa-levodopa (SINEMET IR) 25-100 MG per tablet Take 1.5 tablets by mouth 3 (three) times daily.      . diclofenac sodium (VOLTAREN) 1 % GEL Apply 2-4 g topically 4 (four) times daily as needed. For arthritis pain      . docusate sodium 100 MG CAPS Take 100 mg by mouth 2 (two) times daily as needed for constipation.  10 capsule    . HYDROcodone-acetaminophen (NORCO/VICODIN) 5-325 MG per tablet Take 0.5 tablets by mouth every 6 (six) hours as needed for pain.  30 tablet  0  . rasagiline (AZILECT) 1 MG TABS 1/2 pill daily in AM x 2 weeks, then 1 pill once daily in AM thereafter.  30 tablet  5  . [DISCONTINUED] oxyCODONE-acetaminophen (PERCOCET/ROXICET)  5-325 MG per tablet Take 0.5-1 tablets by mouth every 4 (four) hours as needed. For pain  30 tablet  0  . [DISCONTINUED] polyethylene glycol (MIRALAX / GLYCOLAX) packet Take 17 g by mouth 2 (two) times daily as needed (for constipation).  14 each     No facility-administered encounter medications on file as of 07/10/2013.   Review of Systems  Respiratory:       Snoring  Cardiovascular: Positive for leg swelling.  Musculoskeletal: Positive for joint swelling and arthralgias.  Psychiatric/Behavioral: Positive for sleep disturbance (restless legs).   Objective:  Neurologic Exam  Physical Exam Physical Examination:   Filed Vitals:  07/10/13 1107  BP: 133/85  Pulse: 61  Temp: 97 F (36.1 C)   General Examination: The patient is a very pleasant 79 y.o. male in no acute distress.  HEENT: Normocephalic, atraumatic, pupils are equal, round and reactive to light and accommodation. Extraocular tracking shows mild saccadic breakdown without nystagmus noted. There is limitation to upper gaze. There is mild decrease in eye blink rate. Hearing is intact. Face is symmetric with minimal facial masking and normal facial sensation. There is no lip, neck or jaw tremor. Neck is moderately rigid with intact passive ROM. There are no carotid bruits on auscultation. Oropharynx exam reveals mild mouth dryness. No significant airway crowding is noted. Mallampati is class I. Tongue protrudes centrally and palate elevates symmetrically.   There is no drooling.   Chest: is clear to auscultation without wheezing, rhonchi or crackles noted.  Heart: sounds are regular and normal without murmurs, rubs or gallops noted.   Abdomen: is soft, non-tender and non-distended with normal bowel sounds appreciated on auscultation.  Extremities: There is trace pitting edema in the distal lower extremities bilaterally. He is wearing compression socks up to the knees.   Skin: is warm and dry with no trophic changes noted.  Age-related changes are noted on the skin.   Musculoskeletal: exam reveals: lumbar kyphosis and significant decrease in ROM of the R shoulder. He has L hip discomfort.  Neurologically:  Mental status: The patient is awake and alert, paying good  attention. He is able to partially provide the history. His wife provides details. He is oriented to: person, place, time/date, situation, day of week, month of year and year. His memory, attention, language and knowledge are fairly intact. There is no aphasia, agnosia, apraxia or anomia. There is a mild degree of bradyphrenia. Speech is mildly hypophonic with mild dysarthria noted. Mood is congruent and affect is normal.  Cranial nerves are as described above under HEENT exam. In addition, shoulder shrug is normal with equal shoulder height noted.  Motor exam: Normal bulk, and strength for age is noted. There are no dyskinesias noted.   Tone is mildly rigid in the RUE, with absence of cogwheeling. There is overall mild bradykinesia. There is no drift or rebound.  There is no tremor.    Reflexes are 1+ in the upper extremities and none in the lower extremities.   Fine motor skills exam: Finger taps are mildly impaired on the right and mildly impaired on the left. Hand movements are mildly impaired on the right and mildly impaired on the left. RAP (rapid alternating patting) is mildly impaired on the right and mildly impaired on the left. Foot taps are moderately impaired on the right and moderately impaired on the left. Foot agility (in the form of heel stomping) is moderately impaired on the right and moderately impaired on the left.    Cerebellar testing shows no dysmetria or intention tremor on finger to nose testing. There is no truncal or gait ataxia.   Sensory exam is intact to light touch.   Gait, station and balance: He stands up from the seated position with moderate difficulty and does need to push up with His hands. He needs no assistance. No  veering to one side is noted. He is not noted to lean to the side. Posture is moderately stooped. Stance is wide-based. He walks with decrease in stride length and pace and has to rely on his wheeled walker. He has some start hesitation and does not pick up his feet  very well. He turns and 5 steps and has difficulty turning with 3 brief episodes of freezing noted. Tandem walk is not possible. Balance is moderately impaired. He is not able to do a toe or heel stance.      Assessment and Plan:   In summary, Jahking A Nester is a very pleasant 79 y.o.-year old male with a history of gait dysfunction, degenerative spine disease especially lower back disease, and some signs of parkinsonism. While I doubt that he has idiopathic Parkinson's disease he does exhibit some Parkinsonian features including drooling, mild masked facies, hypophonia, and his gait disorder may be more of a multifactorial problem. Certainly, his gait dysfunction continues to be more pronounced than his other Parkinsonian findings at this time. He had no significant favorable response with selegiline or levodopa. Again, this speaks against idiopathic Parkinson's disease. I explained his recent sleep study results to him and his wife in detail today and while he does not have significant obstructive sleep disordered breathing, he did have a lower baseline O2 saturation and nocturnal hypoxemia. He has mild RLS symptoms and had severe PLMs, but very little arousals. I do want to suggest a pulmonary consult at this time. He is advised to quit smoking altogether. Medication wise, I suggested no changes at this time. We may consider using a dopamine agonists for his restless legs down the Road. At this juncture he's not really bothered by his slight kinking or by RLS symptoms. I will see him back in about 6 months, sooner if the need arises. They are encouraged to call with any interim questions, concerns, problems or updates and refill requests. They  were in agreement.

## 2013-07-10 NOTE — Patient Instructions (Addendum)
I think overall you are doing fairly well and are stable at this point.   I do have some generic suggestions for you today:  Please make sure that you drink plenty of fluids. I would like for you to exercise daily for example in the form of walking 20-30 minutes every day, if you can. Please keep a regular sleep-wake schedule, keep regular meal times, do not skip any meals, eat  healthy snacks in between meals, such as fruit or nuts. Try to eat protein with every meal.   As far as your medications are concerned, I would like to suggest: no changes.   Please stop smoking.    As far as diagnostic testing, I recommend: Pulmonary consultation for lower oxygen values in sleep.  Engage in social activities in your community and with your family and try to keep up with current events by reading the newspaper or watching the news.  I do not think we need to make any changes in your medications at this point. I think you're stable enough that I can see you back in 6 months, sooner if we need to. Please call us if you have any interim questions, concerns, or problems or updates to need to discuss.  Brett Canales is my clinical assistant and will answer any of your questions and relay your messages to me and will give you my messages.   Our phone number is [phone]. We also have an after hours call service for urgent matters and there is a physician on-call for urgent questions. For any emergencies you know to call 911 or go to the nearest emergency room.

## 2013-08-07 ENCOUNTER — Encounter: Payer: Self-pay | Admitting: Pulmonary Disease

## 2013-08-07 ENCOUNTER — Ambulatory Visit (INDEPENDENT_AMBULATORY_CARE_PROVIDER_SITE_OTHER): Payer: Medicare Other | Admitting: Pulmonary Disease

## 2013-08-07 VITALS — BP 130/88 | HR 78 | Temp 98.0°F | Ht 69.0 in | Wt 209.4 lb

## 2013-08-07 DIAGNOSIS — J439 Emphysema, unspecified: Secondary | ICD-10-CM | POA: Insufficient documentation

## 2013-08-07 DIAGNOSIS — G4734 Idiopathic sleep related nonobstructive alveolar hypoventilation: Secondary | ICD-10-CM | POA: Insufficient documentation

## 2013-08-07 DIAGNOSIS — J438 Other emphysema: Secondary | ICD-10-CM

## 2013-08-07 DIAGNOSIS — R0902 Hypoxemia: Secondary | ICD-10-CM

## 2013-08-07 MED ORDER — ALBUTEROL SULFATE HFA 108 (90 BASE) MCG/ACT IN AERS: 2.0000 | INHALATION_SPRAY | Freq: Four times a day (QID) | RESPIRATORY_TRACT | Status: DC | PRN

## 2013-08-07 NOTE — Assessment & Plan Note (Signed)
I suspect his is multifactorial.  He does have mild copd, but probably not enough by itself to drop his sats while sleeping.  His parkinson's disease may contribute as well via hypoventilation.  At this point, I do not think he requires oxygen.

## 2013-08-07 NOTE — Patient Instructions (Addendum)
Your mild drop in oxygen level at night while sleeping may be due to your copd.  It is not enough at this time to justify wearing oxygen at night. Will start on albuterol inhaler, 2 puffs every 6hrs only if needed for shortness of breath.  Do not use if you do not need. Try and quit smoking cigars Pulmonary followup is not required unless your breathing worsens.

## 2013-08-07 NOTE — Assessment & Plan Note (Signed)
The patient has mild airflow obstruction on his spirometry today, but it is not severe enough to require a maintenance bronchodilator regimen.  The patient is also not overly symptomatic.  At this point, I have given him a rescue inhaler to use as needed, and have encouraged him to quit smoking cigars as well.

## 2013-08-07 NOTE — Addendum Note (Signed)
Addended by: Maisie Fus on: 08/07/2013 03:29 PM   Modules accepted: Orders

## 2013-08-07 NOTE — Progress Notes (Signed)
  Subjective:    Patient ID: Jose Vega, male    DOB: August 06, 1934, 79 y.o.   MRN: [id number]  HPI The patient is a 79 year old male who I've been asked to see for nocturnal hypoxemia.  He underwent a sleep study in July of this year, which did not show significant obstructive sleep apnea.  However, he did have oxygen desaturation as low as 82%, but only spent 23 minutes less than 88% the entire night.  The patient denies having any pulmonary diagnosis, and denies any breathing issues with mild to moderate exertional activity.  He does require a walker which limits his degree of exertion.  He has a history of smoking 3 packs a day for 30+ years, and then changed over to cigars.  He has never had spirometry, nor has he had a recent chest x-ray.  As far as he knows, he denies any history of heart disease.  He has occasional cough with no significant mucous production, and denies chest congestion.   Review of Systems  Constitutional: Negative for fever and unexpected weight change.  HENT: Negative for ear pain, nosebleeds, congestion, sore throat, rhinorrhea, sneezing, trouble swallowing, dental problem, postnasal drip and sinus pressure.   Eyes: Negative for redness and itching.  Respiratory: Negative for cough, chest tightness, shortness of breath and wheezing.   Cardiovascular: Negative for palpitations and leg swelling.  Gastrointestinal: Negative for nausea and vomiting.  Genitourinary: Negative for dysuria.  Musculoskeletal: Negative for joint swelling.  Skin: Negative for rash.  Neurological: Negative for headaches.  Hematological: Does not bruise/bleed easily.  Psychiatric/Behavioral: Negative for dysphoric mood. The patient is not nervous/anxious.        Objective:   Physical Exam Constitutional:  Frail appearing male, no acute distress  HENT:  Nares patent without discharge  Oropharynx without exudate, palate and uvula are normal  Eyes:  Perrla, eomi, no scleral icterus  Neck:   No JVD, no TMG  Cardiovascular:  Normal rate, regular rhythm, no rubs or gallops.  No murmurs        Intact distal pulses but definitely diminished.  Pulmonary :  Mildly decreased breath sounds, no stridor or respiratory distress   No rales, rhonchi, or wheezing  Abdominal:  Soft, nondistended, bowel sounds present.  No tenderness noted.   Musculoskeletal: mild lower extremity edema noted.  Lymph Nodes:  No cervical lymphadenopathy noted  Skin:  No cyanosis noted  Neurologic:  Alert, appropriate, moves all 4 extremities without obvious deficit.         Assessment & Plan:

## 2013-09-11 ENCOUNTER — Emergency Department (HOSPITAL_COMMUNITY)
Admission: EM | Admit: 2013-09-11 | Discharge: 2013-09-11 | Disposition: A | Discharge status: Home / Self Care | Payer: Medicare Other | Attending: Emergency Medicine | Admitting: Emergency Medicine

## 2013-09-11 ENCOUNTER — Emergency Department (HOSPITAL_COMMUNITY): Payer: Medicare Other

## 2013-09-11 ENCOUNTER — Encounter (HOSPITAL_COMMUNITY): Payer: Self-pay | Admitting: Emergency Medicine

## 2013-09-11 DIAGNOSIS — I10 Essential (primary) hypertension: Secondary | ICD-10-CM | POA: Insufficient documentation

## 2013-09-11 DIAGNOSIS — S42301A Unspecified fracture of shaft of humerus, right arm, initial encounter for closed fracture: Secondary | ICD-10-CM

## 2013-09-11 DIAGNOSIS — G20 Parkinson's disease: Secondary | ICD-10-CM | POA: Insufficient documentation

## 2013-09-11 DIAGNOSIS — Z8739 Personal history of other diseases of the musculoskeletal system and connective tissue: Secondary | ICD-10-CM | POA: Insufficient documentation

## 2013-09-11 DIAGNOSIS — S42209A Unspecified fracture of upper end of unspecified humerus, initial encounter for closed fracture: Secondary | ICD-10-CM | POA: Insufficient documentation

## 2013-09-11 DIAGNOSIS — G20A1 Parkinson's disease without dyskinesia, without mention of fluctuations: Secondary | ICD-10-CM | POA: Insufficient documentation

## 2013-09-11 DIAGNOSIS — R259 Unspecified abnormal involuntary movements: Secondary | ICD-10-CM | POA: Insufficient documentation

## 2013-09-11 DIAGNOSIS — Y9289 Other specified places as the place of occurrence of the external cause: Secondary | ICD-10-CM | POA: Insufficient documentation

## 2013-09-11 DIAGNOSIS — Z79899 Other long term (current) drug therapy: Secondary | ICD-10-CM | POA: Insufficient documentation

## 2013-09-11 DIAGNOSIS — W1809XA Striking against other object with subsequent fall, initial encounter: Secondary | ICD-10-CM | POA: Insufficient documentation

## 2013-09-11 DIAGNOSIS — IMO0002 Reserved for concepts with insufficient information to code with codable children: Secondary | ICD-10-CM | POA: Insufficient documentation

## 2013-09-11 DIAGNOSIS — Y9389 Activity, other specified: Secondary | ICD-10-CM | POA: Insufficient documentation

## 2013-09-11 DIAGNOSIS — F17200 Nicotine dependence, unspecified, uncomplicated: Secondary | ICD-10-CM | POA: Insufficient documentation

## 2013-09-11 MED ORDER — HYDROMORPHONE HCL PF 1 MG/ML IJ SOLN
0.5000 mg | Freq: Once | INTRAMUSCULAR | Status: AC
  Administered 2013-09-11: 0.5 mg via INTRAVENOUS
  Filled 2013-09-11: qty 1

## 2013-09-11 MED ORDER — HYDROMORPHONE HCL PF 1 MG/ML IJ SOLN
INTRAMUSCULAR | Status: AC
  Administered 2013-09-11: 0.5 mg via INTRAVENOUS
  Filled 2013-09-11: qty 1

## 2013-09-11 MED ORDER — HYDROMORPHONE HCL PF 1 MG/ML IJ SOLN
0.5000 mg | Freq: Once | INTRAMUSCULAR | Status: AC
  Administered 2013-09-11: 0.5 mg via INTRAVENOUS

## 2013-09-11 MED ORDER — SODIUM CHLORIDE 0.9 % IV SOLN: 0.5000 mg/h | INTRAVENOUS | Status: DC

## 2013-09-11 MED ORDER — HYDROCODONE-ACETAMINOPHEN 5-325 MG PO TABS: 0.5000 | ORAL_TABLET | Freq: Four times a day (QID) | ORAL | Status: DC | PRN

## 2013-09-11 NOTE — ED Notes (Signed)
Patient transported to X-ray 

## 2013-09-11 NOTE — ED Provider Notes (Signed)
CSN: [id number]     Arrival date & time 09/11/13  1155 History   First MD Initiated Contact with Patient 09/11/13 1157     Chief Complaint  Patient presents with  . Arm Pain   (Consider location/radiation/quality/duration/timing/severity/associated sxs/prior Treatment) HPI Patient presents immediately after a mechanical fall.  Patient states that he was transferring from a golf cart, to walking when he fell against the side of a building.  He did not fall to the ground, had no head trauma, no loss of consciousness. Since that time he has had pain in the right upper arm, right anterior shoulder.  Pain is worse with motion.  No attempts at relief with anything as far.  No distal dysesthesia or weakness.  No other complaints. Past Medical History  Diagnosis Date  . Parkinson's disease   . Hypertension   . AAA (abdominal aortic aneurysm)   . Spondylolisthesis   . Lumbar degenerative disc disease    Past Surgical History  Procedure Laterality Date  . Total knee arthroplasty      bilateral  . Hernia repair  7 years ago   Family History  Problem Relation Age of Onset  . Diabetes Father   . Diabetes Brother   . Cancer Brother   . Parkinsonism Mother    History  Substance Use Topics  . Smoking status: Current Some Day Smoker    Types: Cigars  . Smokeless tobacco: Never Used     Comment: 2-3 cigars daily  . Alcohol Use: 0.6 oz/week    1 Glasses of wine per week     Comment: 1 glass of wine at night    Review of Systems  All other systems reviewed and are negative.    Allergies  Aspirin adult low and Ibuprofen  Home Medications   Current Outpatient Rx  Name  Route  Sig  Dispense  Refill  . acetaminophen (TYLENOL) 325 MG tablet   Oral   Take 650 mg by mouth at bedtime as needed. For pain         . albuterol (PROVENTIL HFA;VENTOLIN HFA) 108 (90 BASE) MCG/ACT inhaler   Inhalation   Inhale 2 puffs into the lungs every 6 (six) hours as needed for shortness of breath.   1 Inhaler   6     HOLD until patient requests this to be filled. Tha ...   . amLODipine (NORVASC) 5 MG tablet   Oral   Take 5 mg by mouth daily.         . citalopram (CELEXA) 20 MG tablet   Oral   Take 20 mg by mouth daily.         . rasagiline (AZILECT) 1 MG TABS tablet   Oral   Take 1 mg by mouth daily.         Marland Kitchen HYDROcodone-acetaminophen (NORCO/VICODIN) 5-325 MG per tablet   Oral   Take 0.5 tablets by mouth every 6 (six) hours as needed for pain.   30 tablet   0    BP 148/79  Pulse 69  Temp(Src) 97.6 F (36.4 C) (Oral)  Resp 18  Ht 6\' 1"  (1.854 m)  Wt 209 lb (94.802 kg)  BMI 27.58 kg/m2  SpO2 97% Physical Exam  Nursing note and vitals reviewed. Constitutional: He is oriented to person, place, and time. He appears well-developed. No distress.  HENT:  Head: Normocephalic and atraumatic.  Eyes: Conjunctivae and EOM are normal.  Cardiovascular: Normal rate and regular rhythm.   Pulmonary/Chest: Effort  normal. No stridor. No respiratory distress.  Abdominal: He exhibits no distension.  Musculoskeletal: He exhibits no edema.       Right elbow: Normal.      Right wrist: Normal.  Right wrist is unremarkable, though the patient will not flex the biceps secondary to pain in the arm.  There is tenderness to palpation, with no appreciable deformity about the proximal upper arm.  The scapula is unremarkable.  The patient will not perform range of motion or strength testing in the right shoulder secondary to pain. Clavicle is unremarkable.   Neurological: He is alert and oriented to person, place, and time. He displays tremor. No cranial nerve deficit. He displays no seizure activity.  Skin: Skin is warm and dry.  Psychiatric: He has a normal mood and affect.    ED Course  Procedures (including critical care time) Labs Review Labs Reviewed - No data to display Imaging Review Dg Shoulder Right  09/11/2013   CLINICAL DATA:  fall, anterior shoulder pain  EXAM:  RIGHT SHOULDER - 2+ VIEW  COMPARISON:  None.  FINDINGS: Two views of right shoulder submitted. There is mild displaced subcapital fracture of proximal right humerus. Mild inferior spurring of acromion.  IMPRESSION: Mild displaced subcapital fracture of proximal right humerus.   Electronically Signed   By: Natasha Mead M.D.   On: 09/11/2013 13:58   Dg Humerus Right  09/11/2013   CLINICAL DATA:  Fall, shoulder pain  EXAM: RIGHT HUMERUS - 2+ VIEW  COMPARISON:  None.  FINDINGS: Two views of the right humerus submitted. There is displaced subcapital fracture proximal right humerus.  IMPRESSION: Displaced subcapital fracture proximal right humerus.   Electronically Signed   By: Natasha Mead M.D.   On: 09/11/2013 13:59    EKG Interpretation   None      Update: I discussed the patient's case with his orthopedist.  Patient's orthopedist will see him in the office this week. MDM   1. Humerus fracture, right, closed, initial encounter    This gentleman with Parkinson's disease presents after a mechanical fall.  Notably, patient had no head trauma, no loss of consciousness, no other notable complaints beyond right arm pain.  He is distally neurovascularly intact.  However, there is evidence for proximal humerus fracture.  Patient had immobilization with sling, which was already in his possession, and was discharged with analgesics, orthopedics followup.    Gerhard Munch, MD 09/11/13 1414

## 2013-09-11 NOTE — ED Notes (Signed)
Per EMS pt from home with c/o fall, possible arm deformity to right arm. No LOC. Pt lost balance getting out of golf cart and hit against house. Previous shoulder injury, EMS reused pt's sling. VSS. Fentanyl given in route. IV 20G LAC.

## 2013-09-24 ENCOUNTER — Encounter: Payer: Self-pay | Admitting: Vascular Surgery

## 2013-09-24 ENCOUNTER — Ambulatory Visit: Payer: Medicare Other | Admitting: Neurology

## 2013-09-25 ENCOUNTER — Ambulatory Visit (INDEPENDENT_AMBULATORY_CARE_PROVIDER_SITE_OTHER): Payer: Medicare Other | Admitting: Vascular Surgery

## 2013-09-25 ENCOUNTER — Encounter: Payer: Self-pay | Admitting: Vascular Surgery

## 2013-09-25 ENCOUNTER — Ambulatory Visit
Admission: RE | Admit: 2013-09-25 | Discharge: 2013-09-25 | Disposition: A | Discharge status: Home / Self Care | Payer: Medicare Other | Source: Ambulatory Visit | Attending: Vascular Surgery | Admitting: Vascular Surgery

## 2013-09-25 VITALS — BP 161/101 | HR 73 | Resp 18 | Ht 72.0 in | Wt 205.0 lb

## 2013-09-25 DIAGNOSIS — I714 Abdominal aortic aneurysm, without rupture: Secondary | ICD-10-CM

## 2013-09-25 LAB — CREATININE, SERUM: Creat: 1.26 mg/dL (ref 0.50–1.35)

## 2013-09-25 LAB — BUN+CREAT: BUN/Creatinine Ratio: 17.5 Ratio

## 2013-09-25 LAB — BUN: BUN: 22 mg/dL (ref 6–23)

## 2013-09-25 MED ORDER — IOHEXOL 350 MG/ML SOLN
80.0000 mL | Freq: Once | INTRAVENOUS | Status: AC | PRN
  Administered 2013-09-25: 80 mL via INTRAVENOUS

## 2013-09-25 NOTE — Progress Notes (Signed)
Subjective:     Patient ID: Jose Vega, male   DOB: 1934-10-11, 79 y.o.   MRN: [id number]  HPI this 79 year old male is being followed for an abdominal aortic aneurysm which previously measured approximately 4.8 cm in maximum diameter. He returns today for followup of his CT angiogram having been performed earlier today. 2 weeks ago he fell and fractured his right humerus is being followed by Dr. Berna Spare duda.  Past Medical History  Diagnosis Date  . Parkinson's disease   . Hypertension   . AAA (abdominal aortic aneurysm)   . Spondylolisthesis   . Lumbar degenerative disc disease     History  Substance Use Topics  . Smoking status: Current Some Day Smoker    Types: Cigars  . Smokeless tobacco: Never Used     Comment: 2-3 cigars daily  . Alcohol Use: 0.6 oz/week    1 Glasses of wine per week     Comment: 1 glass of wine at night    Family History  Problem Relation Age of Onset  . Diabetes Father   . Diabetes Brother   . Cancer Brother   . Parkinsonism Mother     Allergies  Allergen Reactions  . Aspirin Adult Low [Aspirin] Other (See Comments)    GI BLEED/ulcers  . Ibuprofen     Irritates stomach    Current outpatient prescriptions:acetaminophen (TYLENOL) 325 MG tablet, Take 650 mg by mouth at bedtime as needed. For pain, Disp: , Rfl: ;  amLODipine (NORVASC) 5 MG tablet, Take 5 mg by mouth daily., Disp: , Rfl: ;  HYDROcodone-acetaminophen (NORCO/VICODIN) 5-325 MG per tablet, Take 0.5 tablets by mouth every 6 (six) hours as needed for pain., Disp: 30 tablet, Rfl: 0 albuterol (PROVENTIL HFA;VENTOLIN HFA) 108 (90 BASE) MCG/ACT inhaler, Inhale 2 puffs into the lungs every 6 (six) hours as needed for shortness of breath., Disp: 1 Inhaler, Rfl: 6;  citalopram (CELEXA) 20 MG tablet, Take 20 mg by mouth daily., Disp: , Rfl: ;  rasagiline (AZILECT) 1 MG TABS tablet, Take 1 mg by mouth daily., Disp: , Rfl:   BP 161/101  Pulse 73  Resp 18  Ht 6' (1.829 m)  Wt 205 lb (92.987 kg)   BMI 27.80 kg/m2  Body mass index is 27.8 kg/(m^2).          Review of Systems patient does have Parkinson's disease and has been nonambulatory for 3 years. Rides in a wheelchair in a golf [place]. Denies any chest pain but does have a history of phlebitis and swelling in legs.    Objective:   Physical Exam BP 161/101  Pulse 73  Resp 18  Ht 6' (1.829 m)  Wt 205 lb (92.987 kg)  BMI 27.80 kg/m2  Gen. well-developed elderly male no apparent distress alert and oriented x3 in a wheelchair Lungs no rhonchi or wheezing Abdomen nontender   Today I ordered a CT angiogram of the abdomen and pelvis which I reviewed the computer. Maximum diameter of the aneurysm at this time is 4.8 cm. There is no mural thrombus. The neck of the aneurysm is conical in shape and if it becomes large enough to require treatment we'll need to repeat CT angiogram to see if patient is an acceptable candidate.      Assessment:    4.8 cm infrarenal abdominal aortic aneurysm    patient with Parkinson's disease A recent right humeral fracture Plan:     Return in 6 months with a duplex scan  of the aortic aneurysm in our office If aneurysm becomes large enough to require treatment we'll need to repeat CT angiogram to see if stent graft candidate because of conical-shaped neck

## 2013-10-03 ENCOUNTER — Encounter: Payer: Self-pay | Admitting: Vascular Surgery

## 2013-10-29 ENCOUNTER — Encounter (HOSPITAL_COMMUNITY): Payer: Self-pay | Admitting: *Deleted

## 2013-10-29 ENCOUNTER — Inpatient Hospital Stay (HOSPITAL_COMMUNITY)
Admission: AD | Admit: 2013-10-29 | Discharge: 2013-11-01 | DRG: 377 | Disposition: A | Discharge status: Home Health | Payer: Medicare Other | Source: Ambulatory Visit | Attending: Internal Medicine | Admitting: Internal Medicine

## 2013-10-29 DIAGNOSIS — G20A1 Parkinson's disease without dyskinesia, without mention of fluctuations: Secondary | ICD-10-CM | POA: Diagnosis present

## 2013-10-29 DIAGNOSIS — K269 Duodenal ulcer, unspecified as acute or chronic, without hemorrhage or perforation: Secondary | ICD-10-CM | POA: Diagnosis present

## 2013-10-29 DIAGNOSIS — K2980 Duodenitis without bleeding: Secondary | ICD-10-CM | POA: Diagnosis present

## 2013-10-29 DIAGNOSIS — D62 Acute posthemorrhagic anemia: Secondary | ICD-10-CM | POA: Diagnosis present

## 2013-10-29 DIAGNOSIS — D649 Anemia, unspecified: Secondary | ICD-10-CM | POA: Diagnosis present

## 2013-10-29 DIAGNOSIS — Q762 Congenital spondylolisthesis: Secondary | ICD-10-CM

## 2013-10-29 DIAGNOSIS — M5137 Other intervertebral disc degeneration, lumbosacral region: Secondary | ICD-10-CM | POA: Diagnosis present

## 2013-10-29 DIAGNOSIS — I10 Essential (primary) hypertension: Secondary | ICD-10-CM | POA: Diagnosis present

## 2013-10-29 DIAGNOSIS — I714 Abdominal aortic aneurysm, without rupture: Secondary | ICD-10-CM

## 2013-10-29 DIAGNOSIS — R1013 Epigastric pain: Secondary | ICD-10-CM | POA: Diagnosis present

## 2013-10-29 DIAGNOSIS — R300 Dysuria: Secondary | ICD-10-CM

## 2013-10-29 DIAGNOSIS — F17200 Nicotine dependence, unspecified, uncomplicated: Secondary | ICD-10-CM | POA: Diagnosis present

## 2013-10-29 DIAGNOSIS — K921 Melena: Principal | ICD-10-CM | POA: Diagnosis present

## 2013-10-29 DIAGNOSIS — M51379 Other intervertebral disc degeneration, lumbosacral region without mention of lumbar back pain or lower extremity pain: Secondary | ICD-10-CM | POA: Diagnosis present

## 2013-10-29 DIAGNOSIS — K922 Gastrointestinal hemorrhage, unspecified: Secondary | ICD-10-CM | POA: Diagnosis present

## 2013-10-29 DIAGNOSIS — G8929 Other chronic pain: Secondary | ICD-10-CM | POA: Diagnosis present

## 2013-10-29 DIAGNOSIS — M25551 Pain in right hip: Secondary | ICD-10-CM

## 2013-10-29 DIAGNOSIS — Z833 Family history of diabetes mellitus: Secondary | ICD-10-CM

## 2013-10-29 DIAGNOSIS — Z860100 Personal history of colon polyps, unspecified: Secondary | ICD-10-CM

## 2013-10-29 DIAGNOSIS — R2681 Unsteadiness on feet: Secondary | ICD-10-CM

## 2013-10-29 DIAGNOSIS — Z8711 Personal history of peptic ulcer disease: Secondary | ICD-10-CM

## 2013-10-29 DIAGNOSIS — G4734 Idiopathic sleep related nonobstructive alveolar hypoventilation: Secondary | ICD-10-CM

## 2013-10-29 DIAGNOSIS — Z96659 Presence of unspecified artificial knee joint: Secondary | ICD-10-CM

## 2013-10-29 DIAGNOSIS — Z6825 Body mass index (BMI) 25.0-25.9, adult: Secondary | ICD-10-CM

## 2013-10-29 DIAGNOSIS — G20 Parkinson's disease: Secondary | ICD-10-CM

## 2013-10-29 DIAGNOSIS — J439 Emphysema, unspecified: Secondary | ICD-10-CM

## 2013-10-29 DIAGNOSIS — M1990 Unspecified osteoarthritis, unspecified site: Secondary | ICD-10-CM | POA: Diagnosis present

## 2013-10-29 DIAGNOSIS — R110 Nausea: Secondary | ICD-10-CM | POA: Diagnosis present

## 2013-10-29 DIAGNOSIS — R269 Unspecified abnormalities of gait and mobility: Secondary | ICD-10-CM | POA: Diagnosis present

## 2013-10-29 DIAGNOSIS — E43 Unspecified severe protein-calorie malnutrition: Secondary | ICD-10-CM | POA: Diagnosis present

## 2013-10-29 LAB — COMPREHENSIVE METABOLIC PANEL
ALT: 20 U/L (ref 0–53)
AST: 29 U/L (ref 0–37)
Alkaline Phosphatase: 68 U/L (ref 39–117)
CO2: 25 mEq/L (ref 19–32)
Calcium: 9.1 mg/dL (ref 8.4–10.5)
Chloride: 108 mEq/L (ref 96–112)
Creatinine, Ser: 1.44 mg/dL — ABNORMAL HIGH (ref 0.50–1.35)
GFR calc Af Amer: 53 mL/min — ABNORMAL LOW (ref >90)
GFR calc non Af Amer: 45 mL/min — ABNORMAL LOW (ref >90)
Glucose, Bld: 96 mg/dL (ref 70–99)
Sodium: 144 mEq/L (ref 135–145)
Total Bilirubin: 0.5 mg/dL (ref 0.3–1.2)

## 2013-10-29 LAB — CBC
Hemoglobin: 7.5 g/dL — ABNORMAL LOW (ref 13.0–17.0)
MCH: 30.2 pg (ref 26.0–34.0)
MCHC: 32.8 g/dL (ref 30.0–36.0)
Platelets: 194 10*3/uL (ref 150–400)
RBC: 2.48 MIL/uL — ABNORMAL LOW (ref 4.22–5.81)
WBC: 10.5 10*3/uL (ref 4.0–10.5)

## 2013-10-29 MED ORDER — ACETAMINOPHEN 325 MG PO TABS
650.0000 mg | ORAL_TABLET | ORAL | Status: DC | PRN
  Filled 2013-10-29: qty 2

## 2013-10-29 MED ORDER — ONDANSETRON HCL 4 MG PO TABS: 4.0000 mg | ORAL_TABLET | Freq: Four times a day (QID) | ORAL | Status: DC | PRN

## 2013-10-29 MED ORDER — ZOLPIDEM TARTRATE 5 MG PO TABS
5.0000 mg | ORAL_TABLET | Freq: Every evening | ORAL | Status: DC | PRN
  Filled 2013-10-29: qty 1

## 2013-10-29 MED ORDER — CITALOPRAM HYDROBROMIDE 20 MG PO TABS
20.0000 mg | ORAL_TABLET | Freq: Every day | ORAL | Status: DC
  Administered 2013-10-31 – 2013-11-01 (×2): 20 mg via ORAL
  Filled 2013-10-29 (×3): qty 1

## 2013-10-29 MED ORDER — INFLUENZA VAC SPLIT QUAD 0.5 ML IM SUSP: 0.5000 mL | INTRAMUSCULAR | Status: DC

## 2013-10-29 MED ORDER — HYDROCODONE-ACETAMINOPHEN 5-325 MG PO TABS: 0.5000 | ORAL_TABLET | Freq: Four times a day (QID) | ORAL | Status: DC | PRN

## 2013-10-29 MED ORDER — ONDANSETRON HCL 4 MG/2ML IJ SOLN: 4.0000 mg | Freq: Four times a day (QID) | INTRAMUSCULAR | Status: DC | PRN

## 2013-10-29 MED ORDER — RASAGILINE MESYLATE 1 MG PO TABS
1.0000 mg | ORAL_TABLET | Freq: Every day | ORAL | Status: DC
Start: 2013-10-29 — End: 2013-11-01
  Administered 2013-10-29 – 2013-11-01 (×3): 1 mg via ORAL
  Filled 2013-10-29 (×4): qty 1

## 2013-10-29 MED ORDER — PANTOPRAZOLE SODIUM 40 MG IV SOLR
40.0000 mg | Freq: Two times a day (BID) | INTRAVENOUS | Status: DC
  Administered 2013-10-29 – 2013-10-30 (×2): 40 mg via INTRAVENOUS
  Filled 2013-10-29 (×3): qty 40

## 2013-10-29 MED ORDER — ALUM & MAG HYDROXIDE-SIMETH 200-200-20 MG/5ML PO SUSP: 30.0000 mL | Freq: Four times a day (QID) | ORAL | Status: DC | PRN

## 2013-10-29 MED ORDER — AMLODIPINE BESYLATE 5 MG PO TABS
5.0000 mg | ORAL_TABLET | Freq: Every day | ORAL | Status: DC
  Administered 2013-10-29: 5 mg via ORAL
  Filled 2013-10-29 (×2): qty 1

## 2013-10-29 MED ORDER — POTASSIUM CHLORIDE IN NACL 20-0.9 MEQ/L-% IV SOLN
INTRAVENOUS | Status: DC
  Administered 2013-10-29: 20:00:00 via INTRAVENOUS
  Filled 2013-10-29 (×7): qty 1000

## 2013-10-29 NOTE — H&P (Addendum)
Jose Vega is an 79 y.o. male.   Chief Complaint: black bowel movements HPI:  The patient is a 79 year old man with multiple medical problems including hypertension, lumbar spine degenerative disc disease, Parkinson's disease, and gait instability who over the past few days has had decreased food and fluid intake as well as some black tarry bowel movements and epigastric pain.  He has a history of peptic ulcer disease (DU in 2013) and for unclear reasons had stopped taking his Prilosec medication.  He has not had dizziness, chest pain, or shortness of breath, but has had some nausea without hematemesis. He does not regularly take nonsteroidal anti-inflammatory drugs.  Past Medical History  Diagnosis Date  . Parkinson's disease   . Hypertension   . AAA (abdominal aortic aneurysm)   . Spondylolisthesis   . Lumbar degenerative disc disease     Medications Prior to Admission  Medication Sig Dispense Refill  . acetaminophen (TYLENOL) 325 MG tablet Take 650 mg by mouth at bedtime as needed. For pain      . albuterol (PROVENTIL HFA;VENTOLIN HFA) 108 (90 BASE) MCG/ACT inhaler Inhale 2 puffs into the lungs every 6 (six) hours as needed for shortness of breath.  1 Inhaler  6  . amLODipine (NORVASC) 5 MG tablet Take 5 mg by mouth daily.      . citalopram (CELEXA) 20 MG tablet Take 20 mg by mouth daily.      Marland Kitchen HYDROcodone-acetaminophen (NORCO/VICODIN) 5-325 MG per tablet Take 0.5 tablets by mouth every 6 (six) hours as needed for pain.  30 tablet  0  . rasagiline (AZILECT) 1 MG TABS tablet Take 1 mg by mouth daily.        ADDITIONAL HOME MEDICATIONS: no additional medications  PHYSICIANS INVOLVED IN CARE: Jarome Matin (PCP), Charlotte Sanes (GI), [facility]  Past Surgical History  Procedure Laterality Date  . Total knee arthroplasty      bilateral  . Hernia repair  7 years ago  . Joint replacement      Family History  Problem Relation Age of Onset  . Diabetes Father   . Diabetes  Brother   . Cancer Brother   . Parkinsonism Mother      Social History:  reports that he has been smoking Cigars.  He has never used smokeless tobacco. He reports that he drinks about 0.6 ounces of alcohol per week. He reports that he does not use illicit drugs.  Allergies:  Allergies  Allergen Reactions  . Aspirin Adult Low [Aspirin] Other (See Comments)    GI BLEED/ulcers  . Ibuprofen     Irritates stomach     ROS: anemia, ankle swelling, arthritis and parkinsons disease with gait instability, recent fall with right humerus fracture  PHYSICAL EXAM: Blood pressure 135/92, pulse 113, temperature 98.2 F (36.8 C), temperature source Oral, resp. rate 20, height 6' 0.5" (1.842 m), weight 86.274 kg (190 lb 3.2 oz), SpO2 97.00%. The patient is a frail elderly white man who was in no apparent distress while sitting upright in a wheelchair.  He has his usual somewhat muffled voice.  HEENT exam was within normal limits, neck was supple without jugular venous distention or carotid bruit, chest was clear to auscultation, heart had a regular rate and rhythm, abdomen had normal bowel sounds and no hepatosplenomegaly or tenderness, rectal exam had normal sphincter tone and black stool which was strongly Hemoccult positive, extremities had bilateral 1+ leg edema and bilateral 1+ pedal pulses.  He was alert and  answered questions appropriately.  He had bilateral 4/5 quadriceps strength and could stand with standby assistance.  No results found for this or any previous visit (from the past 48 hour(s)). No results found.  Today office laboratory study results: White blood cell count 9.5, hemoglobin 8.4, hematocrit 25.0, MCV 93, platelets 265, serum sodium 145, potassium 4.0, chloride 109, carbon dioxide 29, BUN 44, creatinine 1.4, glucose 114, total protein 6.8, albumin 3.5, AST 23, ALT 13, alkaline phosphatase 67, total bilirubin 0.6  Office labs from January 12, 2013: White blood cell 7.8,  hemoglobin 13.2, hematocrit 38.5, platelets 227, serum sodium 140, potassium 4.7, chloride 105, carbon dioxide 26, BUN 23, creatinine 1.3  Assessment/Plan #1 Upper GI Bleed and Melena: he appears to have had a subacute upper GI bleed with black bowel movements and a hematocrit about 25 in our office with an elevated BUN level of about 45.  At baseline he is quite weak, and now he requires moderate assistance with ambulation.  We will start him on IV fluids and IV proton pump inhibitor treatment.  We will recheck his basic metabolic panel and CBC in the morning, and we'll request a GI specialist evaluation for the possibility of doing an endoscopy study.  We will also check an H. Pylorus serology.  #2 Parkinson's disease and gait instability: Moderately severe and we will continue current medications and have a physical therapy evaluation.  Because of his weakness he may need short-term skilled nursing facility rehabilitation. #3 Low Back Pain: chronic and moderate from osteoarthritis and degenerative disc disease.  Tira Lafferty G 10/29/2013, 7:13 PM   His case was discussed with Dr. Ewing Schlein who will have his partner see the patient in the A.M. For probable endoscopic evaluation.

## 2013-10-30 ENCOUNTER — Encounter (HOSPITAL_COMMUNITY)
Admission: AD | Disposition: A | Discharge status: Home Health | Payer: Self-pay | Source: Ambulatory Visit | Attending: Internal Medicine

## 2013-10-30 ENCOUNTER — Encounter (HOSPITAL_COMMUNITY): Payer: Self-pay

## 2013-10-30 HISTORY — PX: ESOPHAGOGASTRODUODENOSCOPY: SHX5428

## 2013-10-30 LAB — ABO/RH: ABO/RH(D): O POS

## 2013-10-30 LAB — BASIC METABOLIC PANEL
Calcium: 8.6 mg/dL (ref 8.4–10.5)
GFR calc non Af Amer: 44 mL/min — ABNORMAL LOW (ref >90)
Glucose, Bld: 82 mg/dL (ref 70–99)
Potassium: 3.5 mEq/L (ref 3.5–5.1)
Sodium: 145 mEq/L (ref 135–145)

## 2013-10-30 LAB — CBC
HCT: 19.9 % — ABNORMAL LOW (ref 39.0–52.0)
Hemoglobin: 6.6 g/dL — CL (ref 13.0–17.0)
MCHC: 33.2 g/dL (ref 30.0–36.0)
MCV: 92.6 fL (ref 78.0–100.0)
Platelets: 146 10*3/uL — ABNORMAL LOW (ref 150–400)
RBC: 2.15 MIL/uL — ABNORMAL LOW (ref 4.22–5.81)
WBC: 7 10*3/uL (ref 4.0–10.5)

## 2013-10-30 LAB — MRSA PCR SCREENING: MRSA by PCR: INVALID — AB

## 2013-10-30 SURGERY — EGD (ESOPHAGOGASTRODUODENOSCOPY)
Anesthesia: Moderate Sedation

## 2013-10-30 MED ORDER — FENTANYL CITRATE 0.05 MG/ML IJ SOLN
INTRAMUSCULAR | Status: AC
  Filled 2013-10-30: qty 2

## 2013-10-30 MED ORDER — ACETAMINOPHEN 325 MG PO TABS
650.0000 mg | ORAL_TABLET | Freq: Once | ORAL | Status: AC
  Administered 2013-10-30: 650 mg via ORAL

## 2013-10-30 MED ORDER — SODIUM CHLORIDE 0.9 % IV SOLN
80.0000 mg | Freq: Once | INTRAVENOUS | Status: AC
  Administered 2013-10-30: 80 mg via INTRAVENOUS
  Filled 2013-10-30: qty 80

## 2013-10-30 MED ORDER — PANTOPRAZOLE SODIUM 40 MG IV SOLR: 40.0000 mg | Freq: Two times a day (BID) | INTRAVENOUS | Status: DC

## 2013-10-30 MED ORDER — MIDAZOLAM HCL 10 MG/2ML IJ SOLN
INTRAMUSCULAR | Status: DC | PRN
  Administered 2013-10-30: 1 mg via INTRAVENOUS
  Administered 2013-10-30: 2 mg via INTRAVENOUS

## 2013-10-30 MED ORDER — MIDAZOLAM HCL 10 MG/2ML IJ SOLN
INTRAMUSCULAR | Status: AC
  Filled 2013-10-30: qty 2

## 2013-10-30 MED ORDER — SODIUM CHLORIDE 0.9 % IV SOLN
8.0000 mg/h | INTRAVENOUS | Status: DC
  Administered 2013-10-31: 8 mg/h via INTRAVENOUS
  Filled 2013-10-30 (×5): qty 80

## 2013-10-30 MED ORDER — FENTANYL CITRATE 0.05 MG/ML IJ SOLN
INTRAMUSCULAR | Status: DC | PRN
  Administered 2013-10-30: 12.5 ug via INTRAVENOUS
  Administered 2013-10-30: 25 ug via INTRAVENOUS

## 2013-10-30 MED ORDER — SODIUM CHLORIDE 0.9 % IV SOLN
INTRAVENOUS | Status: DC
  Administered 2013-10-30: 500 mL via INTRAVENOUS

## 2013-10-30 NOTE — Interval H&P Note (Signed)
History and Physical Interval Note:  10/30/2013 3:46 PM  Jose Vega  has presented today for surgery, with the diagnosis of gib  The various methods of treatment have been discussed with the patient and family. After consideration of risks, benefits and other options for treatment, the patient has consented to  Procedure(s): ESOPHAGOGASTRODUODENOSCOPY (EGD) (N/A) as a surgical intervention .  The patient's history has been reviewed, patient examined, no change in status, stable for surgery.  I have reviewed the patient's chart and labs.  Questions were answered to the patient's satisfaction.     Sybol Morre C.

## 2013-10-30 NOTE — Consult Note (Signed)
Referring Provider: Dr. Eloise Harman Primary Care Physician:  Garlan Fillers, MD   Reason for Consultation:  Melena  HPI: Jose Vega is a 79 y.o. male admitted for a GI bleed manifested as soft black tarry stools 2-3 times per day with periumbilical pain and weakness. Found to be anemic at Dr. [name] office and admitted to the hospital and Hgb 7.5 yesterday and 6.6 today. Denies hematochezia or hematemesis. Reports nausea and vomiting of phlegm intermittently for the last several days. Reports being on a pain med recently for his shoulder but denies any NSAIDs. History of a duodenal ulcer in 2013 and reportedly stopped taking his PPI. Denies chest pain, dizziness, or lightheadedness. Reports history of colon polyps and thinks he last had a colonoscopy 3 years ago.    Past Medical History  Diagnosis Date  . Parkinson's disease   . Hypertension   . AAA (abdominal aortic aneurysm)   . Spondylolisthesis   . Lumbar degenerative disc disease     Past Surgical History  Procedure Laterality Date  . Total knee arthroplasty      bilateral  . Hernia repair  7 years ago  . Joint replacement      Prior to Admission medications   Medication Sig Start Date End Date Taking? Authorizing Provider  acetaminophen (TYLENOL) 325 MG tablet Take 650 mg by mouth at bedtime as needed. For pain   Yes Historical Provider, MD  albuterol (PROVENTIL HFA;VENTOLIN HFA) 108 (90 BASE) MCG/ACT inhaler Inhale 2 puffs into the lungs every 6 (six) hours as needed for shortness of breath. 08/07/13  Yes Barbaraann Share, MD  amLODipine (NORVASC) 5 MG tablet Take 5 mg by mouth daily.   Yes Historical Provider, MD  HYDROcodone-acetaminophen (NORCO/VICODIN) 5-325 MG per tablet Take 0.5 tablets by mouth every 6 (six) hours as needed for pain. 09/11/13  Yes Gerhard Munch, MD  rasagiline (AZILECT) 1 MG TABS tablet Take 1 mg by mouth daily.   Yes Historical Provider, MD    Scheduled Meds: . citalopram  20 mg Oral Daily   . pantoprazole (PROTONIX) IV  40 mg Intravenous Q12H  . rasagiline  1 mg Oral Daily   Continuous Infusions: . 0.9 % NaCl with KCl 20 mEq / L 75 mL/hr at 10/29/13 2000   PRN Meds:.acetaminophen, alum & mag hydroxide-simeth, HYDROcodone-acetaminophen, ondansetron (ZOFRAN) IV, ondansetron, zolpidem  Allergies as of 10/29/2013 - Review Complete 10/29/2013  Allergen Reaction Noted  . Aspirin adult low [aspirin] Other (See Comments) 09/29/2012  . Ibuprofen  05/26/2012    Family History  Problem Relation Age of Onset  . Diabetes Father   . Diabetes Brother   . Cancer Brother   . Parkinsonism Mother     History   Social History  . Marital Status: Married    Spouse Name: Bonita Quin    Number of Children: 3  . Years of Education: College   Occupational History  . Retired    Social History Main Topics  . Smoking status: Current Some Day Smoker    Types: Cigars  . Smokeless tobacco: Never Used     Comment: 2-3 cigars daily  . Alcohol Use: 0.6 oz/week    1 Glasses of wine per week     Comment: 1 glass of wine at night  . Drug Use: No  . Sexual Activity: Not on file   Other Topics Concern  . Not on file   Social History Narrative   Patient lives at home with his spouse.  Caffeine Use: coffee and tea daily    Review of Systems: All negative except as stated above in HPI.  Physical Exam: Vital signs: Filed Vitals:   10/30/13 0619  BP: 102/65  Pulse: 76  Temp: 97.8 F (36.6 C)  Resp: 20   Last BM Date:  (PTA, states it was black , not observed by RN this shift) General:  Elderly, frail, pleasant and cooperative in NAD HEENT: anicteric Neck: supple, nontender Lungs:  Clear throughout to auscultation.   No wheezes, crackles, or rhonchi. No acute distress. Heart:  Regular rate and rhythm; no murmurs, clicks, rubs,  or gallops. Abdomen: soft, NT, ND, +BS  Rectal:  Deferred Ext: no edema  GI:  Lab Results:  Recent Labs  10/29/13 2109 10/30/13 0550  WBC 10.5  7.0  HGB 7.5* 6.6*  HCT 22.9* 19.9*  PLT 194 146*   BMET  Recent Labs  10/29/13 2109 10/30/13 0550  NA 144 145  K 3.6 3.5  CL 108 111  CO2 25 24  GLUCOSE 96 82  BUN 43* 36*  CREATININE 1.44* 1.47*  CALCIUM 9.1 8.6   LFT  Recent Labs  10/29/13 2109  PROT 6.2  ALBUMIN 3.4*  AST 29  ALT 20  ALKPHOS 68  BILITOT 0.5   PT/INR No results found for this basename: LABPROT, INR,  in the last 72 hours   Studies/Results: No results found.  Impression/Plan: 79 yo with melena likely due to an upper GI source (most likely ulcer). EGD today to further evaluate. NPO. IV PPI Q 12 hours.    LOS: 1 day   Yamilex Borgwardt C.  10/30/2013, 10:49 AM

## 2013-10-30 NOTE — Progress Notes (Signed)
0830 order for blood taken on night shift, nurse entered on wrong patient, corrected and ordered on this patient.  Sharrell Ku RN

## 2013-10-30 NOTE — Progress Notes (Signed)
Subjective: He feels tired and is without abdominal pain or nausea. Hemoglobin has drifted down with IVF and PRBC have been ordered.  Objective: Vital signs in last 24 hours: Temp:  [97.8 F (36.6 C)-98.4 F (36.9 C)] 97.8 F (36.6 C) (12/09 0619) Pulse Rate:  [76-113] 76 (12/09 0619) Resp:  [20] 20 (12/09 0619) BP: (102-135)/(65-92) 102/65 mmHg (12/09 0619) SpO2:  [94 %-100 %] 94 % (12/09 0619) Weight:  [86.274 kg (190 lb 3.2 oz)] 86.274 kg (190 lb 3.2 oz) (12/08 1835) Weight change:    Intake/Output from previous day:     General appearance: alert, cooperative and no distress Resp: clear to auscultation bilaterally Cardio: regular rate and rhythm GI: soft, non-tender; bowel sounds normal; no masses,  no organomegaly Extremities: extremities normal, atraumatic, no cyanosis or edema  Lab Results:  Recent Labs  10/29/13 2109 10/30/13 0550  WBC 10.5 7.0  HGB 7.5* 6.6*  HCT 22.9* 19.9*  PLT 194 146*   BMET  Recent Labs  10/29/13 2109 10/30/13 0550  NA 144 145  K 3.6 3.5  CL 108 111  CO2 25 24  GLUCOSE 96 82  BUN 43* 36*  CREATININE 1.44* 1.47*  CALCIUM 9.1 8.6   CMET CMP     Component Value Date/Time   NA 145 10/30/2013 0550   K 3.5 10/30/2013 0550   CL 111 10/30/2013 0550   CO2 24 10/30/2013 0550   GLUCOSE 82 10/30/2013 0550   BUN 36* 10/30/2013 0550   CREATININE 1.47* 10/30/2013 0550   CREATININE 1.26 09/24/2013 1322   CALCIUM 8.6 10/30/2013 0550   PROT 6.2 10/29/2013 2109   ALBUMIN 3.4* 10/29/2013 2109   AST 29 10/29/2013 2109   ALT 20 10/29/2013 2109   ALKPHOS 68 10/29/2013 2109   BILITOT 0.5 10/29/2013 2109   GFRNONAA 44* 10/30/2013 0550   GFRAA 51* 10/30/2013 0550    CBG (last 3)  No results found for this basename: GLUCAP,  in the last 72 hours  INR RESULTS:   No results found for this basename: INR, PROTIME     Studies/Results: No results found.  Medications: I have reviewed the patient's current medications.  Assessment/Plan: #1 Upper GI  bleed: clinically stable, and will be seen by GI physician today for possible EGD. Await results of H. Pylorus serology. Will continue to monitor CBCs. #2 Parkinsons disease: stable and we will have a PT and OT evaluation to see if brief SNF rehab is indicated for him.  LOS: 1 day   Sofia Vanmeter G 10/30/2013, 8:37 AM

## 2013-10-30 NOTE — Op Note (Signed)
[facility] [address] [place], [address]   ENDOSCOPY PROCEDURE REPORT  PATIENT: Jose Vega, Jose Vega  MR#: [id number] BIRTHDATE: [birth year] , 79  yrs. old GENDER: Male  ENDOSCOPIST: Charlott Rakes, MD REFERRED [id number] Eloise Harman, M.D.  PROCEDURE DATE:  10/30/2013 PROCEDURE:   EGD w/ biopsy ASA CLASS:   Class III INDICATIONS:Melena. MEDICATIONS: Fentanyl 37.5 mcg IV, Versed 3 mg IV, and Cetacaine spray x 2  TOPICAL ANESTHETIC:  DESCRIPTION OF PROCEDURE:   After the risks benefits and alternatives of the procedure were thoroughly explained, informed consent was obtained.  The Pentax Gastroscope [id number]  endoscope was introduced through the mouth and advanced to the bulb of duodenum , limited by Without limitations.   The instrument was slowly withdrawn as the mucosa was fully examined.     FINDINGS: The endoscope was inserted into the oropharynx and esophagus was intubated. The esophagus was normal in appearance. The gastroesophageal junction was normal in appearance.  Endoscope was advanced into the stomach, which revealed a normal stomach with clear fluid seen.  The endoscope was advanced into the duodenal bulb, which was markedly edematous and scattered erythema was seen. In the distal bulb was a large cratered ulcer with flat red spots seen consistent with bleeding stigmata. No active bleeding was seen. Due to the depth and size of this ulcer that covered over 1/2 of the circumference of the distal bulb the endoscope was not advanced into the second portion of duodenum.  Biopsies were taken of the edematous duodenal mucosa. The endoscope was withdrawn back into the stomach and biopsies were taken of the distal stomach. Retroflexion was unremarkable.  COMPLICATIONS: None  ENDOSCOPIC IMPRESSION:     Large duodenal bulb ulcer without active bleeding Duodenitis - s/p biopsy S/P biopsy of distal stomach to check for H. pylori  gastritis  RECOMMENDATIONS: Protonix drip; Follow H/Hs; Ice chips and sips of water ok and change to clears tomorrow; F/U on path   REPEAT EXAM: N/A  _______________________________ Charlott Rakes, MD eSigned:  Charlott Rakes, MD 10/30/2013 4:17 PM    [id number] Eloise Harman, MD  PATIENT NAME:  [name], [name] MR#: [id number]

## 2013-10-30 NOTE — Progress Notes (Signed)
This shift recvd call from lab of critical value hgb 6.6. called [facility] for MD on call ( Dr. Waynard Edwards), awaiting call back.

## 2013-10-30 NOTE — Progress Notes (Signed)
[id number]/Masao Junker, RN, BSN, CCM [phone] Chart Reviewed for discharge and hospital needs. Discharge needs at time of review:  None present will follow for needs. Review of patient progress due on [id number]. 

## 2013-10-30 NOTE — H&P (View-Only) (Signed)
Referring Provider: Dr. Paterson Primary Care Physician:  PATERSON,DANIEL G, MD   Reason for Consultation:  Melena  HPI: Jose Vega is a 79 y.o. male admitted for a GI bleed manifested as soft black tarry stools 2-3 times per day with periumbilical pain and weakness. Found to be anemic at Dr. Paterson's office and admitted to the hospital and Hgb 7.5 yesterday and 6.6 today. Denies hematochezia or hematemesis. Reports nausea and vomiting of phlegm intermittently for the last several days. Reports being on a pain med recently for his shoulder but denies any NSAIDs. History of a duodenal ulcer in 2013 and reportedly stopped taking his PPI. Denies chest pain, dizziness, or lightheadedness. Reports history of colon polyps and thinks he last had a colonoscopy 3 years ago.    Past Medical History  Diagnosis Date  . Parkinson's disease   . Hypertension   . AAA (abdominal aortic aneurysm)   . Spondylolisthesis   . Lumbar degenerative disc disease     Past Surgical History  Procedure Laterality Date  . Total knee arthroplasty      bilateral  . Hernia repair  7 years ago  . Joint replacement      Prior to Admission medications   Medication Sig Start Date End Date Taking? Authorizing Provider  acetaminophen (TYLENOL) 325 MG tablet Take 650 mg by mouth at bedtime as needed. For pain   Yes Historical Provider, MD  albuterol (PROVENTIL HFA;VENTOLIN HFA) 108 (90 BASE) MCG/ACT inhaler Inhale 2 puffs into the lungs every 6 (six) hours as needed for shortness of breath. 08/07/13  Yes Keith M Clance, MD  amLODipine (NORVASC) 5 MG tablet Take 5 mg by mouth daily.   Yes Historical Provider, MD  HYDROcodone-acetaminophen (NORCO/VICODIN) 5-325 MG per tablet Take 0.5 tablets by mouth every 6 (six) hours as needed for pain. 09/11/13  Yes Robert Lockwood, MD  rasagiline (AZILECT) 1 MG TABS tablet Take 1 mg by mouth daily.   Yes Historical Provider, MD    Scheduled Meds: . citalopram  20 mg Oral Daily   . pantoprazole (PROTONIX) IV  40 mg Intravenous Q12H  . rasagiline  1 mg Oral Daily   Continuous Infusions: . 0.9 % NaCl with KCl 20 mEq / L 75 mL/hr at 10/29/13 2000   PRN Meds:.acetaminophen, alum & mag hydroxide-simeth, HYDROcodone-acetaminophen, ondansetron (ZOFRAN) IV, ondansetron, zolpidem  Allergies as of 10/29/2013 - Review Complete 10/29/2013  Allergen Reaction Noted  . Aspirin adult low [aspirin] Other (See Comments) 09/29/2012  . Ibuprofen  05/26/2012    Family History  Problem Relation Age of Onset  . Diabetes Father   . Diabetes Brother   . Cancer Brother   . Parkinsonism Mother     History   Social History  . Marital Status: Married    Spouse Name: Linda    Number of Children: 3  . Years of Education: College   Occupational History  . Retired    Social History Main Topics  . Smoking status: Current Some Day Smoker    Types: Cigars  . Smokeless tobacco: Never Used     Comment: 2-3 cigars daily  . Alcohol Use: 0.6 oz/week    1 Glasses of wine per week     Comment: 1 glass of wine at night  . Drug Use: No  . Sexual Activity: Not on file   Other Topics Concern  . Not on file   Social History Narrative   Patient lives at home with his spouse.     Caffeine Use: coffee and tea daily    Review of Systems: All negative except as stated above in HPI.  Physical Exam: Vital signs: Filed Vitals:   10/30/13 0619  BP: 102/65  Pulse: 76  Temp: 97.8 F (36.6 C)  Resp: 20   Last BM Date:  (PTA, states it was black , not observed by RN this shift) General:  Elderly, frail, pleasant and cooperative in NAD HEENT: anicteric Neck: supple, nontender Lungs:  Clear throughout to auscultation.   No wheezes, crackles, or rhonchi. No acute distress. Heart:  Regular rate and rhythm; no murmurs, clicks, rubs,  or gallops. Abdomen: soft, NT, ND, +BS  Rectal:  Deferred Ext: no edema  GI:  Lab Results:  Recent Labs  10/29/13 2109 10/30/13 0550  WBC 10.5  7.0  HGB 7.5* 6.6*  HCT 22.9* 19.9*  PLT 194 146*   BMET  Recent Labs  10/29/13 2109 10/30/13 0550  NA 144 145  K 3.6 3.5  CL 108 111  CO2 25 24  GLUCOSE 96 82  BUN 43* 36*  CREATININE 1.44* 1.47*  CALCIUM 9.1 8.6   LFT  Recent Labs  10/29/13 2109  PROT 6.2  ALBUMIN 3.4*  AST 29  ALT 20  ALKPHOS 68  BILITOT 0.5   PT/INR No results found for this basename: LABPROT, INR,  in the last 72 hours   Studies/Results: No results found.  Impression/Plan: 79 yo with melena likely due to an upper GI source (most likely ulcer). EGD today to further evaluate. NPO. IV PPI Q 12 hours.    LOS: 1 day   Rukia Mcgillivray C.  10/30/2013, 10:49 AM   

## 2013-10-30 NOTE — Progress Notes (Signed)
INITIAL NUTRITION ASSESSMENT  Pt meets criteria for severe MALNUTRITION in the context of chronic illness as evidenced by <75% estimated energy intake with 7.3% weight loss in the past month.  DOCUMENTATION CODES Per approved criteria  -Severe malnutrition in the context of chronic illness   INTERVENTION: - Diet advancement per MD - Recommend Ensure Complete BID when diet advanced - Will continue to monitor   NUTRITION DIAGNOSIS: Inadequate oral intake related to inability to eat as evidenced by NPO.   Goal: Advance diet as tolerated to regular diet  Monitor:  Weights, labs, diet advancement  Reason for Assessment: Malnutrition screening tool   79 y.o. male  Admitting Dx: Melena  ASSESSMENT: Pt is a 79 year old man with multiple medical problems including hypertension, lumbar spine degenerative disc disease, Parkinson's disease, and gait instability who over the past few days has had decreased food and fluid intake as well as some black tarry bowel movements and epigastric pain. He has a history of peptic ulcer disease and for unclear reasons had stopped taking his Prilosec medication. He has had some nausea without hematemesis. Pt takes Azilect at home.   Met with pt who reports poor appetite for the past month with 10 pound unintended weight loss. States he had nausea on/off for the past week. States he was eating 2 meals/day but minimally for the past few days. Not on any nutritional supplements at home. C/o some overall weakness. Plan is for EGD today.   BUN/Cr slightly elevated with low GFR   Height: Ht Readings from Last 1 Encounters:  10/29/13 6' 0.5" (1.842 m)    Weight: Wt Readings from Last 1 Encounters:  10/29/13 190 lb 3.2 oz (86.274 kg)    Ideal Body Weight: 178 lb   % Ideal Body Weight: 107%  Wt Readings from Last 10 Encounters:  10/29/13 190 lb 3.2 oz (86.274 kg)  10/29/13 190 lb 3.2 oz (86.274 kg)  09/25/13 205 lb (92.987 kg)  09/11/13 209 lb  (94.802 kg)  08/07/13 209 lb 6.4 oz (94.983 kg)  07/10/13 203 lb (92.08 kg)  05/23/13 212 lb (96.163 kg)  03/27/13 210 lb 14.4 oz (95.664 kg)  10/05/12 216 lb 11.4 oz (98.3 kg)    Usual Body Weight: 200 lb per pt  % Usual Body Weight: 95%  BMI:  Body mass index is 25.43 kg/(m^2).  Estimated Nutritional Needs: Kcal: 2150-2350 Protein: 105-125g Fluid: 2.1-2.3L/day  Skin: Intact   Diet Order: NPO  EDUCATION NEEDS: -Education needs addressed - educated pt on low tyramine diet as pt on Azilect. RD contact information provided.    Intake/Output Summary (Last 24 hours) at 10/30/13 1255 Last data filed at 10/30/13 1020  Gross per 24 hour  Intake    300 ml  Output    700 ml  Net   -400 ml    Last BM: PTA  Labs:   Recent Labs Lab 10/29/13 2109 10/30/13 0550  NA 144 145  K 3.6 3.5  CL 108 111  CO2 25 24  BUN 43* 36*  CREATININE 1.44* 1.47*  CALCIUM 9.1 8.6  GLUCOSE 96 82    CBG (last 3)  No results found for this basename: GLUCAP,  in the last 72 hours  Scheduled Meds: . citalopram  20 mg Oral Daily  . pantoprazole (PROTONIX) IV  40 mg Intravenous Q12H  . rasagiline  1 mg Oral Daily    Continuous Infusions: . 0.9 % NaCl with KCl 20 mEq / L 75 mL/hr at 10/29/13  2000    Past Medical History  Diagnosis Date  . Parkinson's disease   . Hypertension   . AAA (abdominal aortic aneurysm)   . Spondylolisthesis   . Lumbar degenerative disc disease     Past Surgical History  Procedure Laterality Date  . Total knee arthroplasty      bilateral  . Hernia repair  7 years ago  . Joint replacement      Levon Hedger MS, RD, LDN [phone] Pager [phone] After Hours Pager

## 2013-10-31 ENCOUNTER — Encounter (HOSPITAL_COMMUNITY): Payer: Self-pay | Admitting: Gastroenterology

## 2013-10-31 DIAGNOSIS — E43 Unspecified severe protein-calorie malnutrition: Secondary | ICD-10-CM | POA: Diagnosis present

## 2013-10-31 LAB — CBC
HCT: 28.4 % — ABNORMAL LOW (ref 39.0–52.0)
Hemoglobin: 9.4 g/dL — ABNORMAL LOW (ref 13.0–17.0)
MCH: 29.5 pg (ref 26.0–34.0)
MCHC: 33.1 g/dL (ref 30.0–36.0)
Platelets: 163 10*3/uL (ref 150–400)
RBC: 3.19 MIL/uL — ABNORMAL LOW (ref 4.22–5.81)
WBC: 7.6 10*3/uL (ref 4.0–10.5)

## 2013-10-31 LAB — TYPE AND SCREEN
ABO/RH(D): O POS
Unit division: 0

## 2013-10-31 MED ORDER — PANTOPRAZOLE SODIUM 40 MG IV SOLR
40.0000 mg | Freq: Two times a day (BID) | INTRAVENOUS | Status: DC
  Administered 2013-10-31 – 2013-11-01 (×2): 40 mg via INTRAVENOUS
  Filled 2013-10-31 (×4): qty 40

## 2013-10-31 MED ORDER — PANTOPRAZOLE SODIUM 40 MG PO TBEC: 40.0000 mg | DELAYED_RELEASE_TABLET | Freq: Two times a day (BID) | ORAL | Status: DC

## 2013-10-31 NOTE — Progress Notes (Signed)
Subjective: Large duodenal ulcer diagnosed yesterday, not seen by PT and OT yet.  Objective: Vital signs in last 24 hours: Temp:  [97.3 F (36.3 C)-97.9 F (36.6 C)] 97.8 F (36.6 C) (12/10 0600) Pulse Rate:  [67-76] 67 (12/10 0600) Resp:  [13-22] 18 (12/10 0600) BP: (110-155)/(61-88) 155/82 mmHg (12/10 0600) SpO2:  [95 %-100 %] 95 % (12/10 0600) Weight change:    Intake/Output from previous day: 12/09 0701 - 12/10 0700 In: 660 [P.O.:360; I.V.:300] Out: 700 [Urine:700]   General appearance: alert, cooperative and no distress Resp: clear to auscultation bilaterally Cardio: regular rate and rhythm GI: soft, non-tender; bowel sounds normal; no masses,  no organomegaly Extremities: extremities normal, atraumatic, no cyanosis or edema  Lab Results:  Recent Labs  10/30/13 0550 10/31/13 0600  WBC 7.0 7.6  HGB 6.6* 9.4*  HCT 19.9* 28.4*  PLT 146* 163   BMET  Recent Labs  10/29/13 2109 10/30/13 0550  NA 144 145  K 3.6 3.5  CL 108 111  CO2 25 24  GLUCOSE 96 82  BUN 43* 36*  CREATININE 1.44* 1.47*  CALCIUM 9.1 8.6   CMET CMP     Component Value Date/Time   NA 145 10/30/2013 0550   K 3.5 10/30/2013 0550   CL 111 10/30/2013 0550   CO2 24 10/30/2013 0550   GLUCOSE 82 10/30/2013 0550   BUN 36* 10/30/2013 0550   CREATININE 1.47* 10/30/2013 0550   CREATININE 1.26 09/24/2013 1322   CALCIUM 8.6 10/30/2013 0550   PROT 6.2 10/29/2013 2109   ALBUMIN 3.4* 10/29/2013 2109   AST 29 10/29/2013 2109   ALT 20 10/29/2013 2109   ALKPHOS 68 10/29/2013 2109   BILITOT 0.5 10/29/2013 2109   GFRNONAA 44* 10/30/2013 0550   GFRAA 51* 10/30/2013 0550    CBG (last 3)  No results found for this basename: GLUCAP,  in the last 72 hours  INR RESULTS:   No results found for this basename: INR, PROTIME     Studies/Results: No results found.  Medications: I have reviewed the patient's current medications.  Assessment/Plan: #1 Duodenal ulcer: large and Hct improved after transfusion. Will  recheck CBC in the am and consider discharge if gait is reasonable. #2 Protein calorie malnutrition: severe and will start full liquids today #3 Gait instability: moderate and await PT and OT evaluation   LOS: 2 days   Kharson Rasmusson G 10/31/2013, 7:48 AM

## 2013-10-31 NOTE — Evaluation (Signed)
Occupational Therapy Evaluation Patient Details Name: Jose Vega MRN: [id number] DOB: [birth year] Today's Date: 10/31/2013 Time: 4098-1191 OT Time Calculation (min): 25 min  OT Assessment / Plan / Recommendation History of present illness Jose Vega is a 79 y.o. male admitted for a GI bleed manifested as soft black tarry stools 2-3 times per day with periumbilical pain and weakness. Found to be anemic at Dr. [name] office and admitted to the hospital and Hgb 7.5 yesterday and 6.6 today. Denies hematochezia or hematemesis. Reports nausea and vomiting of phlegm intermittently for the last several days. Reports being on a pain med recently for his shoulder but denies any NSAIDs. History of a duodenal ulcer in 2013 and reportedly stopped taking his PPI. Denies chest pain, dizziness, or lightheadedness. Reports history of colon polyps and thinks he last had a colonoscopy 3 years ago.   Clinical Impression   Pt presents to OT with decreased I with ADL activity s/p admission to hospital.  Pt with decreased I with ADL activity due to problems listed below.  Pt will benefit from skilled OT to increase I with ADL activity and return to PLOF    OT Assessment  Patient needs continued OT Services    Follow Up Recommendations  Home health OT       Equipment Recommendations  3 in 1 bedside comode       Frequency  Min 2X/week    Precautions / Restrictions Precautions Precautions: Fall Precaution Comments: reports falling off golf cart 3 weeks ago - reports he hurt arm- and was under care of Dr Lajoyce Corners.  does not need sling anymore       ADL  Grooming: Wash/dry face;Set up Where Assessed - Grooming: Unsupported sitting Where Assessed - Upper Body Bathing: Unsupported sitting Lower Body Bathing: Minimal assistance Where Assessed - Lower Body Bathing: Unsupported sit to stand Upper Body Dressing: Set up Where Assessed - Upper Body Dressing: Unsupported sitting Lower Body Dressing:  Minimal assistance Where Assessed - Lower Body Dressing: Unsupported sit to stand Toilet Transfer: Min Pension scheme manager Method: Sit to Barista: Bedside commode Toileting - Architect and Hygiene: Min guard Where Assessed - Engineer, mining and Hygiene: Standing    OT Diagnosis: Generalized weakness  OT Problem List: Decreased strength OT Treatment Interventions: Self-care/ADL training;Patient/family education;DME and/or AE instruction   OT Goals(Current goals can be found in the care plan section) Acute Rehab OT Goals 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 male admitted for a GI bleed manifested as soft black tarry stools 2-3 times per day with periumbilical pain and weakness. Found to be anemic at Dr. [name] office and admitted to the hospital and Hgb 7.5 yesterday and 6.6 today. Denies hematochezia or hematemesis. Reports nausea and vomiting of phlegm intermittently for the last several days. Reports being on a pain med recently for his shoulder but denies any NSAIDs. History of a duodenal ulcer in 2013 and reportedly stopped taking his PPI. Denies chest pain, dizziness, or lightheadedness. Reports history of colon polyps and thinks he last had a colonoscopy 3 years ago.       Prior Functioning     Home Living Family/patient expects to be discharged to:: Private residence Living Arrangements: Spouse/significant other Available Help at  Discharge: Family Type of Home: House Home Layout: One level Prior Function Level of Independence: Independent;Independent with assistive device(s) Communication Communication: No difficulties         Vision/Perception Vision - History Baseline  Vision: Wears glasses all the time Patient Visual Report: No change from baseline Vision - Assessment Eye Alignment: Within Functional Limits   Cognition  Cognition Arousal/Alertness: Awake/alert Behavior During Therapy: WFL for tasks assessed/performed Overall Cognitive Status: Within Functional Limits for tasks assessed    Extremity/Trunk Assessment Upper Extremity Assessment Upper Extremity Assessment: RUE deficits/detail (LUE WFL) RUE Deficits / Details: Pt with limited use of RUE s/p fall about a month ago per pt. Pt did demonstate RUE exercises (pendulums) to OT RUE Coordination: decreased gross motor     Mobility Bed Mobility Bed Mobility: Not assessed Details for Bed Mobility Assistance: pt in chair Transfers Transfers: Sit to Stand;Stand to Sit Sit to Stand: 4: Min guard;From chair/3-in-1;With upper extremity assist Stand to Sit: 4: Min guard;To chair/3-in-1;With upper extremity assist           End of Session OT - End of Session Activity Tolerance: Patient tolerated treatment well Patient left: in chair Nurse Communication: Mobility status with call bell with in reach  GO     Jose Vega, Jose Vega 10/31/2013, 11:04 AM

## 2013-10-31 NOTE — Progress Notes (Addendum)
Patient ID: Jose Vega, male   DOB: April 29, 1934, 79 y.o.   MRN: [id number] [facility] Progress Note  Jose Vega 79 y.o. 22-Sep-1934   Subjective: Sitting up on side of bed. Reports eating 2 lunches (on full liquids). Denies abdominal pain. Reports brown stool today.  Objective: Vital signs in last 24 hours: Filed Vitals:   10/31/13 0600  BP: 155/82  Pulse: 67  Temp: 97.8 F (36.6 C)  Resp: 18    Physical Exam: Gen: alert, no acute distress, elderly, frail Abd: soft, nontender, nondistended  Lab Results:  Recent Labs  10/29/13 2109 10/30/13 0550  NA 144 145  K 3.6 3.5  CL 108 111  CO2 25 24  GLUCOSE 96 82  BUN 43* 36*  CREATININE 1.44* 1.47*  CALCIUM 9.1 8.6    Recent Labs  10/29/13 2109  AST 29  ALT 20  ALKPHOS 68  BILITOT 0.5  PROT 6.2  ALBUMIN 3.4*    Recent Labs  10/30/13 0550 10/31/13 0600  WBC 7.0 7.6  HGB 6.6* 9.4*  HCT 19.9* 28.4*  MCV 92.6 89.0  PLT 146* 163   No results found for this basename: LABPROT, INR,  in the last 72 hours    Assessment/Plan: S/P Duodenal ulcer bleed - no further bleeding. Hgb 9.4. Change to IV PPI Q 12 hours. Advance diet. Ok to d/c tomorrow from GI standpoint. Needs to stay on PPI BID X 3 months and then QD indefinitely. Biopsies pending and will f/u with him as outpt. H. Pylori serology positive but would await biopsies prior to treatment unless he has never been treated before. Avoid all NSAIDs. Needs outpt CBC in 1 week. F/U with me in 3-4 weeks.   Gracy Ehly C. 10/31/2013, 2:54 PM

## 2013-10-31 NOTE — Evaluation (Signed)
Physical Therapy Evaluation Patient Details Name: Jose Vega MRN: [id number] DOB: 1934/02/10 Today's Date: 10/31/2013 Time: 7829-5621 PT Time Calculation (min): 17 min  PT Assessment / Plan / Recommendation History of Present Illness  Jose Vega is a 79 y.o. male admitted for a GI bleed manifested as soft black tarry stools 2-3 times per day with periumbilical pain and weakness. Found to be anemic at Dr. [name] office and admitted to the hospital and Hgb 7.5 yesterday and 6.6 today. Denies hematochezia or hematemesis. Reports nausea and vomiting of phlegm intermittently for the last several days. Reports being on a pain med recently for his shoulder but denies any NSAIDs. History of a duodenal ulcer in 2013 and reportedly stopped taking his PPI. Denies chest pain, dizziness, or lightheadedness. Reports history of colon polyps and thinks he last had a colonoscopy 3 years ago.  Clinical Impression  On eval, pt required Min guard assist for mobility-able to ambulate ~225 feet with walker. Demonstrates typical Parkinson's gait deficits throughout ambulation. No LOB during session. Recommend HHPT.     PT Assessment  Patient needs continued PT services    Follow Up Recommendations  Home health PT;Supervision/Assistance - 24 hour    Does the patient have the potential to tolerate intense rehabilitation      Barriers to Discharge        Equipment Recommendations  None recommended by PT    Recommendations for Other Services OT consult   Frequency Min 3X/week    Precautions / Restrictions Precautions Precautions: Fall Precaution Comments: reports falling off golf cart 3 weeks ago - reports he hurt arm- and was under care of Dr Lajoyce Corners.  does not need sling anymore.    Pertinent Vitals/Pain No c/o pain      Mobility  Bed Mobility Bed Mobility: Not assessed Details for Bed Mobility Assistance: pt in chair Transfers Transfers: Sit to Stand;Stand to Sit Sit to Stand: 4: Min  guard;From chair/3-in-1 Stand to Sit: 4: Min guard;To chair/3-in-1 Details for Transfer Assistance: VCs safety, hand placement Ambulation/Gait Ambulation/Gait Assistance: 4: Min guard Ambulation Distance (Feet): 225 Feet Assistive device: Rolling walker Ambulation/Gait Assistance Details: Varying gait pattern-festination, shuffling, step through. VCs for posture, distance from walker, and step length throughout gait. Good gait speed. 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 male admitted for a GI bleed manifested as soft black tarry stools 2-3 times per day with periumbilical pain and weakness. Found to be anemic at Dr. [name] office and admitted to the hospital and Hgb 7.5 yesterday and 6.6 today. Denies hematochezia or hematemesis. Reports nausea and vomiting of phlegm intermittently for the last several days. Reports being on a pain med recently for his shoulder but denies any NSAIDs. History of a duodenal ulcer in 2013 and reportedly stopped taking his PPI. Denies chest pain, dizziness, or lightheadedness. Reports history of colon polyps and thinks he last had a colonoscopy 3 years ago.  Prior Functioning  Home Living Family/patient expects to be discharged to:: Private residence Living Arrangements: Spouse/significant other Available Help at  Discharge: Family Type of Home: House Home Layout: One level Home Equipment: Walker - 4 wheels;Grab bars - tub/shower Prior Function Level of Independence: Independent;Independent with assistive device(s) Communication Communication: No difficulties    Cognition  Cognition Arousal/Alertness: Awake/alert Behavior During Therapy: WFL for tasks assessed/performed Overall Cognitive Status: Within Functional Limits for tasks assessed    Extremity/Trunk Assessment Upper Extremity Assessment Upper Extremity Assessment: Defer to OT evaluation RUE Deficits / Details: Pt with limited use of RUE s/p fall about a month ago per pt. Pt did demonstate RUE exercises (pendulums) to OT RUE Coordination: decreased gross motor Lower Extremity Assessment Lower Extremity Assessment: Generalized weakness Cervical / Trunk Assessment Cervical / Trunk Assessment: Normal   Balance    End of Session PT - End of Session Equipment Utilized During Treatment: Gait belt Activity Tolerance: Patient tolerated treatment well Patient left: in chair;with call bell/phone within reach Nurse Communication: Mobility status  GP     Rebeca Alert, MPT Pager: [phone]

## 2013-11-01 LAB — MRSA CULTURE

## 2013-11-01 LAB — CBC
HCT: 26.6 % — ABNORMAL LOW (ref 39.0–52.0)
Hemoglobin: 9 g/dL — ABNORMAL LOW (ref 13.0–17.0)
MCH: 29.5 pg (ref 26.0–34.0)
MCV: 87.2 fL (ref 78.0–100.0)
RBC: 3.05 MIL/uL — ABNORMAL LOW (ref 4.22–5.81)
RDW: 16.1 % — ABNORMAL HIGH (ref 11.5–15.5)
WBC: 8.5 10*3/uL (ref 4.0–10.5)

## 2013-11-01 MED ORDER — POLYSACCHARIDE IRON COMPLEX 150 MG PO CAPS: 150.0000 mg | ORAL_CAPSULE | Freq: Every day | ORAL | Status: DC

## 2013-11-01 MED ORDER — POLYSACCHARIDE IRON COMPLEX 150 MG PO CAPS
150.0000 mg | ORAL_CAPSULE | Freq: Every day | ORAL | Status: DC
  Administered 2013-11-01: 150 mg via ORAL
  Filled 2013-11-01: qty 1

## 2013-11-01 NOTE — Progress Notes (Signed)
Physical Therapy Treatment Patient Details Name: Jose Vega MRN: [id number] DOB: [birth year] Today's Date: 11/01/2013 Time: 0940-1003 PT Time Calculation (min): 23 min  PT Assessment / Plan / Recommendation  History of Present Illness Jose Vega is a 79 y.o. male admitted for a GI bleed manifested as soft black tarry stools 2-3 times per day with periumbilical pain and weakness. Found to be anemic at Dr. Silvano Vega office and admitted to the hospital and Hgb 7.5 yesterday and 6.6 today. Denies hematochezia or hematemesis. Reports nausea and vomiting of phlegm intermittently for the last several days. Reports being on a pain med recently for his shoulder but denies any NSAIDs. History of a duodenal ulcer in 2013 and reportedly stopped taking his PPI. Denies chest pain, dizziness, or lightheadedness. Reports history of colon polyps and thinks he last had a colonoscopy 3 years ago.   PT Comments   Progressing well with mobility. Assessed ambulation with rollator (pt prefers and will be using at home)-pt performed well with safety cues throughout ambulation. Plan is for d/c home later today. Pt states he feels comfortable about his mobility level. No questions/concerns. Wife is available to assist as needed.   Follow Up Recommendations  Home health PT;Supervision/Assistance - 24 hour     Does the patient have the potential to tolerate intense rehabilitation     Barriers to Discharge        Equipment Recommendations  None recommended by PT    Recommendations for Other Services OT consult  Frequency Min 3X/week   Progress towards PT Goals Progress towards PT goals: Progressing toward goals  Plan Current plan remains appropriate    Precautions / Restrictions Precautions Precautions: Fall Restrictions Weight Bearing Restrictions: No   Pertinent Vitals/Pain No c/o pain    Mobility  Bed Mobility Bed Mobility: Not assessed Details for Bed Mobility Assistance: pt in  chair Transfers Transfers: Sit to Stand;Stand to Sit Sit to Stand: 5: Supervision;From chair/3-in-1 Stand to Sit: 5: Supervision;To chair/3-in-1 Details for Transfer Assistance: VCs safety, hand placement Ambulation/Gait Ambulation/Gait Assistance: 4: Min guard Ambulation Distance (Feet): 150 Feet Assistive device: 4-wheeled walker Ambulation/Gait Assistance Details: Varying gait pattern-festination, shuffling, step through pattern. Vcs posture, distance from walker, step length. good gait speed. No LOB Gait Pattern: Trunk flexed;Festinating;Shuffle;Step-through pattern    Exercises     PT Diagnosis:    PT Problem List:   PT Treatment Interventions:     PT Goals (current goals can now be found in the care plan section)    Visit Information  Last PT Received On: 11/01/13 Assistance Needed: +1 History of Present Illness: Jose Vega is a 79 y.o. male admitted for a GI bleed manifested as soft black tarry stools 2-3 times per day with periumbilical pain and weakness. Found to be anemic at Dr. Silvano Vega office and admitted to the hospital and Hgb 7.5 yesterday and 6.6 today. Denies hematochezia or hematemesis. Reports nausea and vomiting of phlegm intermittently for the last several days. Reports being on a pain med recently for his shoulder but denies any NSAIDs. History of a duodenal ulcer in 2013 and reportedly stopped taking his PPI. Denies chest pain, dizziness, or lightheadedness. Reports history of colon polyps and thinks he last had a colonoscopy 3 years ago.    Subjective Data      Cognition  Cognition Arousal/Alertness: Awake/Vega Behavior During Therapy: WFL for tasks assessed/performed Overall Cognitive Status: Within Functional Limits for tasks assessed    Balance     End of  Session PT - End of Session Equipment Utilized During Treatment: Gait belt Activity Tolerance: Patient tolerated treatment well Patient left: in chair;with call bell/phone within reach    GP     Jose Vega, Jose Vega Pager: [phone]

## 2013-11-01 NOTE — Discharge Summary (Addendum)
Physician Discharge Summary  Patient ID: Jose Vega MRN: [id number] DOB/AGE: 77-Sep-1935 77 y.o.  Admit date: 10/29/2013 Discharge date: 11/08/2013   Discharge Diagnoses:  Principal Problem:   Melena Active Problems:   Anemia, acute blood loss   Gait instability   Upper GI bleeding   Parkinson disease   Protein-calorie malnutrition, severe   Hypertension   Discharged Condition: good  Hospital Course:  The patient is a 79 year old man with multiple medical problems including hypertension, lumbar spine degenerative disc disease, Parkinson's disease, and gait instability who over the past few days has had decreased food and fluid intake as well as some black tarry bowel movements and epigastric pain. He has a history of peptic ulcer disease (DU in 2013) and for unclear reasons had stopped taking his Prilosec medication. He has not had dizziness, chest pain, or shortness of breath, but has had some nausea without hematemesis. He does not regularly take nonsteroidal anti-inflammatory drugs.  He was admitted to a medical bed without telemetry. He was started on IV fluids and high dose IV Protonix. He showed no evidence of ongoing bleeding and had an endoscopy done on the day of admission that showed a large duodenal ulcer without visible vessels and duodenitis. Biopsies of the stomach and duodenum were obtained with results pending at the time of dictation. He also had a H. Pylorus serology done which was positive. It was advised that we wait until results of biopsy before initiating treatment for H. pylorus infection. He was seen by personnel from physical therapy and occupational therapy who felt that he was stable ambulating with a walker and standby assistance, and that he could benefit from outpatient rehabilitation therapies. He was able to be a full liquid diet without any problems and was not having any abdominal discomfort or difficulty swallowing. There are no complications from his  hospitalization.   Consults: GI  Significant Diagnostic Studies:  No results found.  Labs: Lab Results  Component Value Date   WBC 8.5 11/01/2013   HGB 9.0* 11/01/2013   HCT 26.6* 11/01/2013   MCV 87.2 11/01/2013   PLT 171 11/01/2013    No results found for this basename: NA, K, CL, CO2, BUN, CREATININE, CALCIUM, LABALBU, PROT, BILITOT, ALKPHOS, ALT, AST, GLUCOSE,  in the last 168 hours     No results found for this basename: INR,  PROTIME     Recent Results (from the past 240 hour(s))  MRSA CULTURE     Status: None   Collection Time    10/29/13  6:06 PM      Result Value Range Status   Specimen Description NOSE   Final   Special Requests NONE   Final   Culture     Final   Value: NO STAPHYLOCOCCUS AUREUS ISOLATED     Note: NOMRSA     Performed at [facility]   Report Status 11/01/2013 FINAL   Final  MRSA PCR SCREENING     Status: Abnormal   Collection Time    10/30/13  3:57 AM      Result Value Range Status   MRSA by PCR INVALID RESULTS, SPECIMEN SENT FOR CULTURE (*) NEGATIVE Final   Comment: INFORMED QUEEN RN AT [phone] ON 12.9.14 BY SHUEA                The GeneXpert MRSA Assay (FDA     approved for NASAL specimens     only), is one component of a     comprehensive  MRSA colonization     surveillance program. It is not     intended to diagnose MRSA     infection nor to guide or     monitor treatment for     MRSA infections.      Discharge Exam: Blood pressure 138/75, pulse 84, temperature 97.9 F (36.6 C), temperature source Oral, resp. rate 18, height 6' 0.5" (1.842 m), weight 86.274 kg (190 lb 3.2 oz), SpO2 99.00%.  Physical Exam: In general, he is a frail elderly white man who was in no apparent distress while lying at 30 elevation the head of bed. HEENT exam was within normal limits, neck was supple without jugular venous distention or carotid bruit, chest was clear to auscultation, heart had a regular rate and rhythm and was without significant  murmur or gallop, abdomen had normal bowel sounds no tenderness, extremities were without cyanosis, clubbing, or edema. He was alert and was able to provide a good history, and he was able to move all extremities well.  Disposition:  He will be discharged from the hospital today to return to his home with his wife.      Discharge Orders   Future Appointments Provider Department Dept Phone   03/26/2014 8:30 AM [facility] Valarie Merino [phone]   Eat a light meal the night before the exam Nothing to eat or drink for at least 8 hours before exam No gum chewing, or smoking the morning of the exam. Please take your morning medications with small sips of water, especially blood pressure medication *Very Important* Please wear 2 piece clothing   03/26/2014 9:00 AM Pryor Ochoa, MD [facility] [phone]   03/29/2014 11:30 AM Huston Foley, MD [facility] [phone]   Future Orders Complete By Expires   Call MD for:  As directed    Comments:     Rectal bleeding or worsening of dark stools, dizziness, difficulty breathing, or other concerning symptoms.   Diet - low sodium heart healthy  As directed    Discharge instructions  As directed    Comments:     Use your walker or wheelchair to prevent falls. Increase intake of food with high nutritional value such as meats, milk, vegetables, have ensure plus at 1 can 3 times daily, do not have any aspirin or NSAID drugs such as advil, motril, aleve.   Increase activity slowly  As directed        Medication List    STOP taking these medications       amLODipine 5 MG tablet  Commonly known as:  NORVASC      TAKE these medications       acetaminophen 325 MG tablet  Commonly known as:  TYLENOL  Take 650 mg by mouth at bedtime as needed. For pain     albuterol 108 (90 BASE) MCG/ACT inhaler  Commonly known as:  PROVENTIL HFA;VENTOLIN HFA  Inhale 2 puffs into the lungs  every 6 (six) hours as needed for shortness of breath.     AZILECT 1 MG Tabs tablet  Generic drug:  rasagiline  Take 1 mg by mouth daily.     HYDROcodone-acetaminophen 5-325 MG per tablet  Commonly known as:  NORCO/VICODIN  Take 0.5 tablets by mouth every 6 (six) hours as needed for pain.     iron polysaccharides 150 MG capsule  Commonly known as:  NIFEREX  Take 1 capsule (150 mg total) by mouth daily.  pantoprazole 40 MG tablet  Commonly known as:  PROTONIX  Take 1 tablet (40 mg total) by mouth 2 (two) times daily.       Follow-up Information   Follow up with Garlan Fillers, MD. Schedule an appointment as soon as possible for a visit in 1 week.   Specialty:  Internal Medicine   Contact information:   [address] [facility] [place] [address] [phone]       Follow up with SCHOOLER,VINCENT C., MD. Schedule an appointment as soon as possible for a visit in 3 weeks.   Specialty:  Gastroenterology   Contact information:   [address] [place] [address] [phone]       Signed: Garlan Fillers 11/08/2013, 6:20 AM

## 2013-11-07 ENCOUNTER — Ambulatory Visit (INDEPENDENT_AMBULATORY_CARE_PROVIDER_SITE_OTHER): Payer: Medicare Other | Admitting: Neurology

## 2013-11-07 ENCOUNTER — Encounter: Payer: Self-pay | Admitting: Neurology

## 2013-11-07 VITALS — BP 151/87 | HR 74 | Temp 97.6°F | Ht 69.0 in

## 2013-11-07 DIAGNOSIS — S42309D Unspecified fracture of shaft of humerus, unspecified arm, subsequent encounter for fracture with routine healing: Secondary | ICD-10-CM

## 2013-11-07 DIAGNOSIS — R269 Unspecified abnormalities of gait and mobility: Secondary | ICD-10-CM

## 2013-11-07 DIAGNOSIS — M479 Spondylosis, unspecified: Secondary | ICD-10-CM

## 2013-11-07 DIAGNOSIS — G20 Parkinson's disease: Secondary | ICD-10-CM

## 2013-11-07 DIAGNOSIS — K264 Chronic or unspecified duodenal ulcer with hemorrhage: Secondary | ICD-10-CM

## 2013-11-07 DIAGNOSIS — S42301D Unspecified fracture of shaft of humerus, right arm, subsequent encounter for fracture with routine healing: Secondary | ICD-10-CM

## 2013-11-07 NOTE — Progress Notes (Signed)
Subjective:    Patient ID: Jose Vega is a 79 y.o. male.  HPI    Interim history:   Mr. Jose Vega is a very pleasant 79 year old right-handed gentleman with an underlying medical history of hypertension, memory loss, vitamin B12 deficiency, peripheral neuropathy and gait disorder, who presents for followup consultation of his parkinsonism. He is accompanied by his wife again today. I last saw him on 07/10/2013, at which time I explained the sleep test results to him. I suggested a referral to pulmonology based on nocturnal hypoxemia in the absence of significant obstructive sleep disordered breathing. He was seen by Dr. Marcelyn Bruins on 08/07/2013 and was advised that he has mild COPD but not enough to use an inhaler every day. He was given a rescue inhaler and was advised to quit smoking cigars. He was advised to FU as needed with pulmonary. Today, he reports, smoking 1-2 cigars daily, she indicates, he definitely smokes more than one per day, no more than 3 a day. He fell in October and injured his R shoulder. He fell against the vinyl siding of the house as he was transferring off of the golf cart. He did not hit the ground or his head. I reviewed his Xrays R shoulder and humerus from 09/11/13 through the PACS system: Mild displaced subcapital fracture of proximal right humerus. He was treated conservatively with a sling. He had melena earlier this month and had an upper EDG on 10/30/13: duodenal ulcer without active bleeding. He was at [facility] and needed 2 transfusions and was treated with IVF and IV protonix, he had stopped the protonix at home before. His H/H was 7.5/22.9, and his hemoglobin dropped to 6.6 on 10/30/13.  I first met him on 05/23/2013 for followup consultation of his parkinsonism. He previously followed with Dr. Avie Echevaria and was last seen by him on 12/28/2012, at which time Dr. Sandria Manly felt that his gait was worse. He tapered him off of selegiline and carbidopa-levodopa and suggested  re-evaluation of his gait as well as rechecking his B12 level, methylmalonic acid, and lumbar spine MRI. He used to have B12 injections once monthly, but is now on oral supplements.  He has a 5+ year history of low volume voice, difficulty getting his words out, memory loss, slowness and moving and drooling. He has a family history of Parkinson's disease in his mother. There is no history of head or neck trauma. He saw Dr. Sandria Manly for the first time in October 2010 and was on Sinemet for suspected Parkinson's disease. At the time, he had evidence of peripheral neuropathy and memory loss with an MMSE score of 25. Celexa was added for mood changes, but he stopped it. He has had a progressive gait disorder and has had recurrent falls. MRI of brain and C-spine in July 2010 showed age-appropriate small vessel disease and atrophy and spondylitic changes without disc herniation or compression of the spinal cord. T. spine MRI in November 2012 showed mild degenerative joint disease without cord compression. Blood work included serum protein electrophoresis, TSH, RPR, vitamin B12, serum methylmalonic acid, all normal with the exception of low vitamin B12 for which she has been on B12 injections. Dr. love asked him to stop driving. In August 2013 he fell and fractured his right hip. He did not have hip surgery. In February 2014 his MMSE was 29, clock drawing was 4, animal fluency was 16.  He had a diagnostic sleep study on 06/12/2013: Sleep efficiency was reduced at 47.5% with a  latency to sleep of 61 minutes and wake after sleep onset at 194 minutes with mild sleep fragmentation noted that several longer periods of wakefulness. His arousal index was normal at 4.4 arousals per hour. He had a normal percentage of stage I sleep, a markedly increased percentage of stage II sleep, absence of slow-wave sleep and a decreased percentage of REM sleep at 12.6%. REM latency was normal. He had severe periodic leg movements at 123.4 per  hour and an associated arousal index of only 3.4 per hour. He had mild intermittent snoring and a total of 1 obstructive apnea as well as 9 obstructive hypopneas, rendering an overall normal AHI of 2.9 per hour, rising to 4.1 per hour and REM sleep. His baseline oxygen saturation was only 91% with a nadir of 82%. He spent 23 and minutes below the saturation of 88% for the night. He spent 1 hour and 12 minutes below the saturation of 90% for the night.   His Past Medical History Is Significant For: Past Medical History  Diagnosis Date  . Parkinson's disease   . Hypertension   . AAA (abdominal aortic aneurysm)   . Spondylolisthesis   . Lumbar degenerative disc disease     His Past Surgical History Is Significant For: Past Surgical History  Procedure Laterality Date  . Total knee arthroplasty      bilateral  . Hernia repair  7 years ago  . Joint replacement    . Esophagogastroduodenoscopy N/A 10/30/2013    Procedure: ESOPHAGOGASTRODUODENOSCOPY (EGD);  Surgeon: Shirley Friar, MD;  Location: [facility];  Service: Endoscopy;  Laterality: N/A;    His Family History Is Significant For: Family History  Problem Relation Age of Onset  . Diabetes Father   . Diabetes Brother   . Cancer Brother   . Parkinsonism Mother     His Social History Is Significant For: History   Social History  . Marital Status: Married    Spouse Name: Bonita Quin    Number of Children: 3  . Years of Education: College   Occupational History  . Retired    Social History Main Topics  . Smoking status: Current Some Day Smoker    Types: Cigars  . Smokeless tobacco: Never Used     Comment: 2-3 cigars daily  . Alcohol Use: 0.6 oz/week    1 Glasses of wine per week     Comment: 1 glass of wine at night  . Drug Use: No  . Sexual Activity: None   Other Topics Concern  . None   Social History Narrative   Patient lives at home with his spouse.   Caffeine Use: coffee and tea daily    His Allergies Are:   Allergies  Allergen Reactions  . Aspirin Adult Low [Aspirin] Other (See Comments)    GI BLEED/ulcers  . Ibuprofen     Irritates stomach  :   His Current Medications Are:  Outpatient Encounter Prescriptions as of 11/07/2013  Medication Sig  . acetaminophen (TYLENOL) 325 MG tablet Take 650 mg by mouth at bedtime as needed. For pain  . albuterol (PROVENTIL HFA;VENTOLIN HFA) 108 (90 BASE) MCG/ACT inhaler Inhale 2 puffs into the lungs every 6 (six) hours as needed for shortness of breath.  Marland Kitchen HYDROcodone-acetaminophen (NORCO/VICODIN) 5-325 MG per tablet Take 0.5 tablets by mouth every 6 (six) hours as needed for pain.  . iron polysaccharides (NIFEREX) 150 MG capsule Take 1 capsule (150 mg total) by mouth daily.  . pantoprazole (PROTONIX) 40 MG  tablet Take 1 tablet (40 mg total) by mouth 2 (two) times daily.  . rasagiline (AZILECT) 1 MG TABS tablet Take 1 mg by mouth daily.  :  Review of Systems:  Out of a complete 14 point review of systems, all are reviewed and negative with the exception of these symptoms as listed below:   Review of Systems  HENT: Negative.   Eyes: Negative.   Respiratory: Negative.   Cardiovascular: Negative.   Gastrointestinal: Negative.   Endocrine: Negative.   Genitourinary: Negative.   Musculoskeletal: Positive for gait problem.  Skin: Negative.   Allergic/Immunologic: Negative.   Neurological: Positive for speech difficulty and weakness.  Hematological: Negative.   Psychiatric/Behavioral: Positive for sleep disturbance (daytime sleepiness).    Objective:  Neurologic Exam  Physical Exam Physical Examination:   Filed Vitals:   11/07/13 1248  BP: 151/87  Pulse: 74  Temp: 97.6 F (36.4 C)    General Examination: The patient is a very pleasant 79 y.o. male in no acute distress. He appears frail and deconditioned.  HEENT: Normocephalic, atraumatic, pupils are equal, round and reactive to light and accommodation. Extraocular tracking shows mild  saccadic breakdown without nystagmus noted. There is limitation to upper gaze. There is mild decrease in eye blink rate. Hearing is intact. Face is symmetric with mild facial masking and normal facial sensation. There is no lip, neck or jaw tremor. Neck is moderately rigid with intact passive ROM. There are no carotid bruits on auscultation. Oropharynx exam reveals mild mouth dryness. No significant airway crowding is noted. Mallampati is class I. Tongue protrudes centrally and palate elevates symmetrically.   There is mild drooling.   Chest: is clear to auscultation without wheezing, rhonchi or crackles noted.  Heart: sounds are regular and normal without murmurs, rubs or gallops noted.   Abdomen: is soft, non-tender and non-distended with normal bowel sounds appreciated on auscultation.  Extremities: There is trace pitting edema in the distal lower extremities bilaterally up to mid-shin areas. He is not wearing compression socks today.   Skin: is warm and dry with no trophic changes noted. Age-related changes are noted on the skin.   Musculoskeletal: exam reveals: lumbar kyphosis and significant decrease in ROM of the R shoulder. He has L hip discomfort.  Neurologically:  Mental status: The patient is awake and alert, paying good  attention. He is able to partially provide the history. His wife provides details. He is oriented to: person, place, time/date, situation, day of week, month of year and year. His memory, attention, language and knowledge are fairly intact. There is no aphasia, agnosia, apraxia or anomia. There is a mild degree of bradyphrenia. Speech is moderately hypophonic with mild to moderate dysarthria noted. Mood is congruent and affect is normal.  Cranial nerves are as described above under HEENT exam. In addition, shoulder shrug is normal with unequal shoulder height noted.  Motor exam: Normal bulk, and strength for age is noted. There are no dyskinesias noted.   Tone is  mildly rigid in the RUE, with absence of cogwheeling. There is overall mild bradykinesia. There is no drift or rebound.  There is no tremor.    Reflexes are 1+ in the upper extremities and none in the lower extremities.   Fine motor skills exam: Finger taps are mildly impaired on the right and mildly impaired on the left. Hand movements are mildly impaired on the right and mildly impaired on the left. RAP (rapid alternating patting) is mildly impaired on the right and  mildly impaired on the left. Foot taps are moderately impaired on the right and moderately impaired on the left. Foot agility (in the form of heel stomping) is moderately impaired on the right and moderately impaired on the left.    Cerebellar testing shows no dysmetria or intention tremor on finger to nose testing. There is no truncal or gait ataxia.   Sensory exam is intact to light touch.   Gait, station and balance: He stands up from the seated position with moderate difficulty and does need to push up with His hands and needs assistance. No veering to one side is noted. He is not noted to lean to the side. Posture is moderately stooped. Stance is wide-based. He walks with decrease in stride length and pace and has to rely on his wheeled walker. He has some start hesitation and does not pick up his feet very well. He turns and 5 steps and has difficulty turning, no freezing noted today. Tandem walk is not possible. Balance is moderately impaired. He is not able to do a toe or heel stance.      Assessment and Plan:   In summary, Marjorie A Traywick is a very pleasant 79 year old male with a history of gait dysfunction, degenerative spine disease and lower back disease, parkinsonism with recurrent falls and recently sustained a R humerus Fx, was admitted to the hospital with melena, likely from his duodenal ulcer. While I doubt that he has idiopathic Parkinson's disease he does exhibit parkinsonism, some Parkinsonian features including  drooling, mild masked facies, hypophonia, and gait disorder. Overall he seems to have progressed. His gait disorder is likely a multifactorial problem and continues to be more pronounced than his other Parkinsonian findings at this time. He had no significant favorable response with selegiline or levodopa or Azilect. Again, this speaks against idiopathic Parkinson's disease. I explained his test results to him and reviewed his recent ER visit hospital stay records. I had a long discussion with the patient and his wife again today. Unfortunately, there is not a whole lot that weekend do specifically for his parkinsonism her gait disorder other than supportive care. He is advised to use his walker at all times. He is advised to quit smoking cigars as advised by his pulmonologist. He is furthermore advised to change positions slowly and stay well-hydrated. He no longer drives and we can also consider doing another round of physical therapy down the Road. He had some therapy for his right shoulder as I understand. He sees orthopedics for this. He is going to see his primary care physician soon. I will see him back in about 4 months, sooner if the need arises. They are encouraged to call with any interim questions, concerns, problems or updates and refill requests. They were in agreement.

## 2013-11-07 NOTE — Patient Instructions (Signed)
I believe you have a gait disorder, which likely is due to a combination of things: normal aging, degenerative arthritis of your back, parkinsonism.   Remember to drink plenty of fluid, eat healthy meals and do not skip any meals. Try to eat protein with a every meal and eat a healthy snack such as fruit or nuts in between meals. Try to keep a regular sleep-wake schedule and try to exercise daily, particularly in the form of walking, 20-30 minutes a day, if you can. Change positions slowly and you should use your walker at all times.   As far as your medications are concerned, I would like to suggest no new medications. Please stop smoking cigars. We will consider physical therapy again if needed.

## 2013-11-08 DIAGNOSIS — D62 Acute posthemorrhagic anemia: Secondary | ICD-10-CM | POA: Diagnosis present

## 2014-01-10 ENCOUNTER — Ambulatory Visit: Payer: Medicare Other | Admitting: Neurology

## 2014-02-04 ENCOUNTER — Ambulatory Visit: Payer: Medicare Other | Attending: Internal Medicine | Admitting: Physical Therapy

## 2014-02-04 DIAGNOSIS — R269 Unspecified abnormalities of gait and mobility: Secondary | ICD-10-CM | POA: Insufficient documentation

## 2014-02-04 DIAGNOSIS — Z5189 Encounter for other specified aftercare: Secondary | ICD-10-CM | POA: Insufficient documentation

## 2014-02-04 DIAGNOSIS — G20A1 Parkinson's disease without dyskinesia, without mention of fluctuations: Secondary | ICD-10-CM | POA: Insufficient documentation

## 2014-02-04 DIAGNOSIS — R471 Dysarthria and anarthria: Secondary | ICD-10-CM | POA: Insufficient documentation

## 2014-02-04 DIAGNOSIS — G20 Parkinson's disease: Secondary | ICD-10-CM | POA: Insufficient documentation

## 2014-02-12 ENCOUNTER — Ambulatory Visit: Payer: Medicare Other | Admitting: Physical Therapy

## 2014-02-14 ENCOUNTER — Ambulatory Visit: Payer: Medicare Other | Admitting: Speech Pathology

## 2014-02-18 ENCOUNTER — Ambulatory Visit: Payer: Medicare Other | Admitting: Physical Therapy

## 2014-02-21 ENCOUNTER — Ambulatory Visit: Payer: Medicare Other | Attending: Internal Medicine | Admitting: Physical Therapy

## 2014-02-21 ENCOUNTER — Ambulatory Visit: Payer: Medicare Other | Admitting: Speech Pathology

## 2014-02-21 DIAGNOSIS — G20A1 Parkinson's disease without dyskinesia, without mention of fluctuations: Secondary | ICD-10-CM | POA: Insufficient documentation

## 2014-02-21 DIAGNOSIS — G20 Parkinson's disease: Secondary | ICD-10-CM | POA: Insufficient documentation

## 2014-02-21 DIAGNOSIS — R471 Dysarthria and anarthria: Secondary | ICD-10-CM | POA: Insufficient documentation

## 2014-02-21 DIAGNOSIS — R269 Unspecified abnormalities of gait and mobility: Secondary | ICD-10-CM | POA: Insufficient documentation

## 2014-02-21 DIAGNOSIS — Z5189 Encounter for other specified aftercare: Secondary | ICD-10-CM | POA: Insufficient documentation

## 2014-02-25 ENCOUNTER — Ambulatory Visit: Payer: Medicare Other | Admitting: Physical Therapy

## 2014-02-26 ENCOUNTER — Other Ambulatory Visit: Payer: Self-pay | Admitting: Internal Medicine

## 2014-02-26 DIAGNOSIS — R042 Hemoptysis: Secondary | ICD-10-CM

## 2014-02-27 ENCOUNTER — Ambulatory Visit
Admission: RE | Admit: 2014-02-27 | Discharge: 2014-02-27 | Disposition: A | Discharge status: Home / Self Care | Payer: Medicare Other | Source: Ambulatory Visit | Attending: Internal Medicine | Admitting: Internal Medicine

## 2014-02-27 DIAGNOSIS — R042 Hemoptysis: Secondary | ICD-10-CM

## 2014-02-28 ENCOUNTER — Ambulatory Visit: Payer: Medicare Other | Admitting: Physical Therapy

## 2014-03-04 ENCOUNTER — Ambulatory Visit: Payer: Medicare Other | Admitting: Physical Therapy

## 2014-03-05 ENCOUNTER — Ambulatory Visit: Payer: Medicare Other | Admitting: Speech Pathology

## 2014-03-05 ENCOUNTER — Ambulatory Visit: Payer: Medicare Other | Admitting: Physical Therapy

## 2014-03-06 ENCOUNTER — Ambulatory Visit: Payer: Medicare Other | Admitting: Physical Therapy

## 2014-03-07 ENCOUNTER — Ambulatory Visit: Payer: Medicare Other | Admitting: Speech Pathology

## 2014-03-07 ENCOUNTER — Ambulatory Visit: Payer: Medicare Other | Admitting: Physical Therapy

## 2014-03-12 ENCOUNTER — Ambulatory Visit: Payer: Medicare Other

## 2014-03-12 ENCOUNTER — Ambulatory Visit: Payer: Medicare Other | Admitting: Physical Therapy

## 2014-03-14 ENCOUNTER — Ambulatory Visit: Payer: Medicare Other | Admitting: Physical Therapy

## 2014-03-14 ENCOUNTER — Ambulatory Visit: Payer: Medicare Other

## 2014-03-18 ENCOUNTER — Other Ambulatory Visit: Payer: Self-pay | Admitting: Internal Medicine

## 2014-03-18 DIAGNOSIS — R52 Pain, unspecified: Secondary | ICD-10-CM

## 2014-03-19 ENCOUNTER — Ambulatory Visit: Payer: Medicare Other

## 2014-03-19 ENCOUNTER — Ambulatory Visit: Payer: Medicare Other | Admitting: Physical Therapy

## 2014-03-20 ENCOUNTER — Other Ambulatory Visit: Payer: Self-pay | Admitting: Internal Medicine

## 2014-03-20 ENCOUNTER — Other Ambulatory Visit: Payer: Medicare Other

## 2014-03-20 ENCOUNTER — Ambulatory Visit
Admission: RE | Admit: 2014-03-20 | Discharge: 2014-03-20 | Disposition: A | Discharge status: Home / Self Care | Payer: Medicare Other | Source: Ambulatory Visit | Attending: Internal Medicine | Admitting: Internal Medicine

## 2014-03-20 DIAGNOSIS — R52 Pain, unspecified: Secondary | ICD-10-CM

## 2014-03-21 ENCOUNTER — Ambulatory Visit: Payer: Medicare Other | Admitting: Physical Therapy

## 2014-03-22 ENCOUNTER — Encounter (INDEPENDENT_AMBULATORY_CARE_PROVIDER_SITE_OTHER): Payer: Self-pay

## 2014-03-22 ENCOUNTER — Encounter: Payer: Self-pay | Admitting: Pulmonary Disease

## 2014-03-22 ENCOUNTER — Ambulatory Visit (INDEPENDENT_AMBULATORY_CARE_PROVIDER_SITE_OTHER): Payer: Medicare Other | Admitting: Pulmonary Disease

## 2014-03-22 VITALS — BP 146/82 | HR 71 | Ht 69.0 in | Wt 184.0 lb

## 2014-03-22 DIAGNOSIS — R222 Localized swelling, mass and lump, trunk: Secondary | ICD-10-CM

## 2014-03-22 DIAGNOSIS — R918 Other nonspecific abnormal finding of lung field: Secondary | ICD-10-CM | POA: Insufficient documentation

## 2014-03-22 MED ORDER — HYDROCODONE-ACETAMINOPHEN 5-325 MG PO TABS: 1.0000 | ORAL_TABLET | Freq: Four times a day (QID) | ORAL | Status: DC | PRN

## 2014-03-22 NOTE — Patient Instructions (Signed)
Will schedule for PET scan, and will call you with results once available. Will refill your pain medication.

## 2014-03-22 NOTE — Assessment & Plan Note (Signed)
The patient has a large left upper lobe mass which probably resulted in post obstructive pneumonia. His cough and hemoptysis has improved since being on antibiotics, but has not resolved. He is continuing to have left-sided chest pain, and I will refill his pain medication until we can get this evaluated. We could certainly get the diagnosis with bronchoscopy, but I am very concerned about his frailty and how he will do with conscious sedation. He has large adrenal masses that probably represent metastatic disease, and it would be very easy to biopsy these with very little risk to the patient.  He would also stage his disease at the same time. We'll schedule the patient for a PET scan, and see if there is an easier site to biopsy that will put the patient at least amount of risk.

## 2014-03-22 NOTE — Progress Notes (Signed)
   Subjective:    Patient ID: Jose Vega, male    DOB: 1934-08-26, 80 y.o.   MRN: [id number]  HPI Patient comes in today for an acute sick visit. I saw him last year where he was found to have very mild COPD that did not require maintenance bronchodilators. The patient is noting a cough with hemoptysis over the last one to 2 months, and was diagnosed by chest x-ray with a left upper lobe pneumonia the beginning of last month. A CT chest shows a probable mass as well as postobstructive changes. There were also large adrenal glands that were not present on an abdominal CT in November of last year. The patient was treated with antibiotics with significant improvement in his cough and hemoptysis, but it continues to be an issue for him. He had a followup chest CT that did show improved airspace disease, but he continues to have a large left perihilar mass. His adrenal masses have also increased in size.   Review of Systems  Constitutional: Negative for fever and unexpected weight change.  HENT: Positive for congestion. Negative for dental problem, ear pain, nosebleeds, postnasal drip, rhinorrhea, sinus pressure, sneezing, sore throat and trouble swallowing.   Eyes: Negative for redness and itching.  Respiratory: Positive for cough. Negative for chest tightness, shortness of breath and wheezing.   Cardiovascular: Negative for palpitations and leg swelling.  Gastrointestinal: Negative for nausea and vomiting.  Genitourinary: Negative for dysuria.  Musculoskeletal: Negative for joint swelling.  Skin: Negative for rash.  Neurological: Negative for headaches.  Hematological: Does not bruise/bleed easily.  Psychiatric/Behavioral: Negative for dysphoric mood. The patient is not nervous/anxious.        Objective:   Physical Exam Frail-appearing male in no acute distress Nose without purulence or discharge noted Neck without lymphadenopathy or thyromegaly Chest with very diminished breath sounds  associated with poor depth of inspiration. Was unable to hear crackles or wheezes. Cardiac exam with regular rate and rhythm Lower extremities with minimal edema, no cyanosis Alert, moves all 4 extremities.       Assessment & Plan:

## 2014-03-25 ENCOUNTER — Ambulatory Visit (HOSPITAL_COMMUNITY): Payer: Medicare Other

## 2014-03-26 ENCOUNTER — Ambulatory Visit: Payer: Medicare Other | Admitting: Occupational Therapy

## 2014-03-26 ENCOUNTER — Other Ambulatory Visit (HOSPITAL_COMMUNITY): Payer: Medicare Other

## 2014-03-26 ENCOUNTER — Ambulatory Visit: Payer: Medicare Other | Admitting: Physical Therapy

## 2014-03-26 ENCOUNTER — Ambulatory Visit: Payer: Medicare Other | Admitting: Vascular Surgery

## 2014-03-28 ENCOUNTER — Ambulatory Visit: Payer: Medicare Other | Admitting: Physical Therapy

## 2014-03-29 ENCOUNTER — Ambulatory Visit (INDEPENDENT_AMBULATORY_CARE_PROVIDER_SITE_OTHER): Payer: Medicare Other | Admitting: Neurology

## 2014-03-29 ENCOUNTER — Encounter: Payer: Self-pay | Admitting: Neurology

## 2014-03-29 VITALS — BP 126/78 | HR 80 | Temp 97.2°F | Ht 69.0 in | Wt 174.0 lb

## 2014-03-29 DIAGNOSIS — R269 Unspecified abnormalities of gait and mobility: Secondary | ICD-10-CM

## 2014-03-29 DIAGNOSIS — G20C Parkinsonism, unspecified: Secondary | ICD-10-CM

## 2014-03-29 DIAGNOSIS — R296 Repeated falls: Secondary | ICD-10-CM

## 2014-03-29 DIAGNOSIS — R471 Dysarthria and anarthria: Secondary | ICD-10-CM

## 2014-03-29 DIAGNOSIS — Z9181 History of falling: Secondary | ICD-10-CM

## 2014-03-29 DIAGNOSIS — R0902 Hypoxemia: Secondary | ICD-10-CM

## 2014-03-29 DIAGNOSIS — G20 Parkinson's disease: Secondary | ICD-10-CM

## 2014-03-29 DIAGNOSIS — G4734 Idiopathic sleep related nonobstructive alveolar hypoventilation: Secondary | ICD-10-CM

## 2014-03-29 NOTE — Patient Instructions (Signed)
I will refer you to [facility] for a second opinion for parkinsonism. Follow up in 4 months.

## 2014-03-29 NOTE — Progress Notes (Signed)
Subjective:    Patient ID: Jose Vega is a 80 y.o. male.  HPI    Interim history:   Jose Vega is a very pleasant 80 year old right-handed gentleman with an underlying medical history of hypertension, memory loss, vitamin B12 deficiency, peripheral neuropathy and gait disorder, who presents for followup consultation of his parkinsonism. He is accompanied by his wife again today. I last saw him on 11/07/2013, at which time I felt he had a multifactorial gait disorder and some parkinsonism. I did not suggest any new Parkinson's medications as he did not have any favorable response with either selegiline, levodopa or Azilect. I asked him to use his walker at all times and advised him to quit smoking cigars.  Today, his wife reports, that he has reduced his cigars smoking. His voice is softer and that is per patient the biggest decline. No choking, no hallucinations. No significant depression. He has been using the walker. He has been using the WC more this past week. He fell in the shower, and slid down, did not have LOC or hit his head. He recently saw Dr. Gwenette Greet for cough, and hemoptysis and is scheduled for a PET scan for a lung mass on 04/01/14, and his adrenal masses have increased. He is freezing more and his walking stamina has declined per wife.   I saw him on 07/10/2013, at which time I explained the sleep test results to him. I suggested a referral to pulmonology based on nocturnal hypoxemia in the absence of significant obstructive sleep disordered breathing. He was seen by Dr. Danton Sewer on 08/07/2013 and was advised that he has mild COPD but not enough to use an inhaler every day. He was given a rescue inhaler and was advised to quit smoking cigars. He was advised to FU as needed with pulmonary.  He fell in October and injured his R shoulder. He fell against the vinyl siding of the house as he was transferring off of the golf cart. He did not hit the ground or his head. I reviewed his  Xrays R shoulder and humerus from 09/11/13 through the PACS system: Mild displaced subcapital fracture of proximal right humerus. He was treated conservatively with a sling. He had melena earlier this month and had an upper EDG on 10/30/13: duodenal ulcer without active bleeding. He was at [facility] and needed 2 transfusions and was treated with IVF and IV protonix, he had stopped the protonix at home before. His H/H was 7.5/22.9, and his hemoglobin dropped to 6.6 on 10/30/13.  I first met him on 05/23/2013 for followup consultation of his parkinsonism. He previously followed with Dr. Morene Antu and was last seen by him on 12/28/2012, at which time Dr. Erling Cruz felt that his gait was worse. He tapered him off of selegiline and carbidopa-levodopa and suggested re-evaluation of his gait as well as rechecking his B12 level, methylmalonic acid, and lumbar spine MRI. He used to have B12 injections once monthly, but is now on oral supplements.  He has a 6+ year history of low volume voice, difficulty getting his words out, memory loss, slowness and moving and drooling. He has a family history of Parkinson's disease in his mother. There is no history of head or neck trauma. He saw Dr. Erling Cruz for the first time in October 2010 and was on Sinemet for suspected Parkinson's disease. At the time, he had evidence of peripheral neuropathy and memory loss with an MMSE score of 25. Celexa was added for mood changes, but  he stopped it. He has had a progressive gait disorder and has had recurrent falls. MRI of brain and C-spine in July 2010 showed age-appropriate small vessel disease and atrophy and spondylitic changes without disc herniation or compression of the spinal cord. T. spine MRI in November 2012 showed mild degenerative joint disease without cord compression. Blood work included serum protein electrophoresis, TSH, RPR, vitamin B12, serum methylmalonic acid, all normal with the exception of low vitamin B12 for which she has been on B12  injections. Dr. love asked him to stop driving. In August 2013 he fell and fractured his right hip. He did not have hip surgery. In February 2014 his MMSE was 29, clock drawing was 4, animal fluency was 16.  He had a diagnostic sleep study on 06/12/2013: Sleep efficiency was reduced at 47.5% with a latency to sleep of 61 minutes and wake after sleep onset at 194 minutes with mild sleep fragmentation noted that several longer periods of wakefulness. His arousal index was normal at 4.4 arousals per hour. He had a normal percentage of stage I sleep, a markedly increased percentage of stage II sleep, absence of slow-wave sleep and a decreased percentage of REM sleep at 12.6%. REM latency was normal. He had severe periodic leg movements at 123.4 per hour and an associated arousal index of only 3.4 per hour. He had mild intermittent snoring and a total of 1 obstructive apnea as well as 9 obstructive hypopneas, rendering an overall normal AHI of 2.9 per hour, rising to 4.1 per hour and REM sleep. His baseline oxygen saturation was only 91% with a nadir of 82%. He spent 23 and minutes below the saturation of 88% for the night. He spent 1 hour and 12 minutes below the saturation of 90% for the night.   His Past Medical History Is Significant For: Past Medical History  Diagnosis Date  . Parkinson's disease   . Hypertension   . AAA (abdominal aortic aneurysm)   . Spondylolisthesis   . Lumbar degenerative disc disease     His Past Surgical History Is Significant For: Past Surgical History  Procedure Laterality Date  . Total knee arthroplasty      bilateral  . Hernia repair  7 years ago  . Joint replacement    . Esophagogastroduodenoscopy N/A 10/30/2013    Procedure: ESOPHAGOGASTRODUODENOSCOPY (EGD);  Surgeon: Lear Ng, MD;  Location: [facility];  Service: Endoscopy;  Laterality: N/A;    His Family History Is Significant For: Family History  Problem Relation Age of Onset  . Diabetes Father    . Diabetes Brother   . Cancer Brother   . Parkinsonism Mother     His Social History Is Significant For: History   Social History  . Marital Status: Married    Spouse Name: Jose Vega    Number of Children: 3  . Years of Education: College   Occupational History  . Retired    Social History Main Topics  . Smoking status: Current Some Day Smoker    Types: Cigars  . Smokeless tobacco: Never Used     Comment: 2-3 cigars daily  . Alcohol Use: 0.6 oz/week    1 Glasses of wine per week     Comment: 1 glass of wine at night  . Drug Use: No  . Sexual Activity: None   Other Topics Concern  . None   Social History Narrative   Patient lives at home with his spouse.   Caffeine Use: coffee and tea daily  His Allergies Are:  Allergies  Allergen Reactions  . Aspirin Adult Low [Aspirin] Other (See Comments)    GI BLEED/ulcers  . Ibuprofen     Irritates stomach  :   His Current Medications Are:  Outpatient Encounter Prescriptions as of 03/29/2014  Medication Sig  . amLODipine (NORVASC) 5 MG tablet Take 5 mg by mouth daily.  Marland Kitchen HYDROcodone-acetaminophen (NORCO/VICODIN) 5-325 MG per tablet Take 1 tablet by mouth every 6 (six) hours as needed.  . pantoprazole (PROTONIX) 40 MG tablet Take 1 tablet (40 mg total) by mouth 2 (two) times daily.  Marland Kitchen acetaminophen (TYLENOL) 325 MG tablet Take 650 mg by mouth at bedtime as needed. For pain  . iron polysaccharides (NIFEREX) 150 MG capsule Take 1 capsule (150 mg total) by mouth daily.  . rasagiline (AZILECT) 1 MG TABS tablet Take 1 mg by mouth daily.  :  Review of Systems:  Out of a complete 14 point review of systems, all are reviewed and negative with the exception of these symptoms as listed below: Review of Systems  Constitutional: Negative.   HENT: Negative.   Eyes: Negative.   Respiratory: Negative.   Cardiovascular: Negative.   Gastrointestinal: Negative.   Endocrine: Negative.   Genitourinary: Negative.   Musculoskeletal:  Negative.   Skin: Negative.   Allergic/Immunologic: Negative.   Neurological: Negative.   Hematological: Negative.   Psychiatric/Behavioral: Negative.   All other systems reviewed and are negative.   Objective:  Neurologic Exam  Physical Exam Physical Examination:   Filed Vitals:   03/29/14 1104  BP: 126/78  Pulse: 80  Temp: 97.2 F (36.2 C)   General Examination: The patient is a very pleasant 80 y.o. male in no acute distress. He appears frail and deconditioned, situated in a WC.  HEENT: Normocephalic, atraumatic, pupils are equal, round and reactive to light and accommodation. Extraocular tracking shows moderate saccadic breakdown without nystagmus noted. There is limitation to upper gaze, worse. There is moderated decrease in eye blink rate. Hearing is intact. Face is symmetric with mild to moderate facial masking and normal facial sensation. There is no lip, neck or jaw tremor. Neck is moderately rigid with intact passive ROM. There are no carotid bruits on auscultation. Oropharynx exam reveals mild to moderate mouth dryness. No significant airway crowding is noted. Mallampati is class I. Tongue protrudes centrally and palate elevates symmetrically. There is mild drooling.   Chest: is clear to auscultation without wheezing, rhonchi or crackles noted.  Heart: sounds are regular and normal without murmurs, rubs or gallops noted.   Abdomen: is soft, non-tender and non-distended with normal bowel sounds appreciated on auscultation.  Extremities: There is no pitting edema in the distal lower extremities bilaterally up to mid-shin areas. He is not wearing compression socks today.   Skin: is warm and dry with no trophic changes noted. Age-related changes are noted on the skin.   Musculoskeletal: exam reveals: lumbar kyphosis and significant decrease in ROM of the R shoulder. He has L hip discomfort.  Neurologically:  Mental status: The patient is awake and alert, paying good  attention. He is able to minimally provide the history. His wife provides most of the history. He is oriented to: person, place, time/date, situation, day of week, month of year and year. His memory, attention, language and knowledge are mildly impaired. There is no aphasia, agnosia, apraxia or anomia. There is a moderate degree of bradyphrenia. Speech is moderately to severely hypophonic with mild to moderate dysarthria noted. Mood is congruent  and affect is normal.  Cranial nerves are as described above under HEENT exam. In addition, shoulder shrug is normal with unequal shoulder height noted.  Motor exam: Normal bulk, and strength for age is noted. There are no dyskinesias noted.   Tone is mildly rigid in the RUE>LUE, with absence of cogwheeling. There is overall mild bradykinesia. There is no drift or rebound.  There is no tremor.    Reflexes are 1+ in the upper extremities and absent in the lower extremities.   Fine motor skills exam: Finger taps are mildly impaired on the right and mildly impaired on the left. Hand movements are mildly impaired on the right and mildly impaired on the left. RAP (rapid alternating patting) is mildly impaired on the right and mildly impaired on the left. Foot taps are moderately impaired on the right and moderately impaired on the left. Foot agility (in the form of heel stomping) is moderately impaired on the right and moderately impaired on the left.    Cerebellar testing shows no dysmetria or intention tremor on finger to nose testing. There is no truncal or gait ataxia.   Sensory exam is intact to light touch, PP, temperature and vibration in the UEs and LEs.   Gait, station and balance: He stands up from the seated position with moderate difficulty and needs to push up with His hands and needs some assistance. No veering to one side is noted. He is not noted to lean to the side. Posture is moderately stooped. Stance is wide-based. I did not have him walk for me  as he did not bring his walker.  Assessment and Plan:   In summary, Jose Vega is a very pleasant 80 year old male with a history of gait dysfunction, degenerative spine disease and lower back disease, parkinsonism with recurrent falls and injuries, who presents for followup consultation. His parkinsonism has been progressive. I do detect more facial masking, decrease in eye motility and worsening speech today. 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 I talked to the patient and his wife in detail today about this possibility. They do understand that atypical parkinsonism does not have the same rate of success with symptomatic medications unfortunately. He has tried and failed Sinemet. He has also become more frail and deconditioned. He is in the process of being worked up for his lung mass and adrenal mass. I did suggest a second opinion and a bigger movement disorders Center and he would like to go to [place]. I think would be good to have a second set of eyes and ears and get another professional opinion on his neurodegenerative condition. I suggested he continue using his walker at all times for fall risk. I will see him back routinely in about 3 to 4 months, sooner if the need arises. They are encouraged to call with any interim questions, concerns, problems or updates and refill requests. They were in agreement.

## 2014-04-01 ENCOUNTER — Encounter (HOSPITAL_COMMUNITY): Payer: Self-pay

## 2014-04-01 ENCOUNTER — Ambulatory Visit (HOSPITAL_COMMUNITY)
Admission: RE | Admit: 2014-04-01 | Discharge: 2014-04-01 | Disposition: A | Discharge status: Home / Self Care | Payer: Medicare Other | Source: Ambulatory Visit | Attending: Pulmonary Disease | Admitting: Pulmonary Disease

## 2014-04-01 DIAGNOSIS — R918 Other nonspecific abnormal finding of lung field: Secondary | ICD-10-CM

## 2014-04-01 DIAGNOSIS — R222 Localized swelling, mass and lump, trunk: Secondary | ICD-10-CM | POA: Insufficient documentation

## 2014-04-01 DIAGNOSIS — C7970 Secondary malignant neoplasm of unspecified adrenal gland: Secondary | ICD-10-CM | POA: Insufficient documentation

## 2014-04-01 DIAGNOSIS — I714 Abdominal aortic aneurysm, without rupture, unspecified: Secondary | ICD-10-CM | POA: Insufficient documentation

## 2014-04-01 DIAGNOSIS — C771 Secondary and unspecified malignant neoplasm of intrathoracic lymph nodes: Secondary | ICD-10-CM | POA: Insufficient documentation

## 2014-04-01 HISTORY — DX: Other nonspecific abnormal finding of lung field: R91.8

## 2014-04-01 LAB — GLUCOSE, CAPILLARY: GLUCOSE-CAPILLARY: 91 mg/dL (ref 70–99)

## 2014-04-01 MED ORDER — FLUDEOXYGLUCOSE F - 18 (FDG) INJECTION
10.1000 | Freq: Once | INTRAVENOUS | Status: AC | PRN
  Administered 2014-04-01: 10.1 via INTRAVENOUS

## 2014-04-04 ENCOUNTER — Other Ambulatory Visit: Payer: Self-pay | Admitting: Pulmonary Disease

## 2014-04-04 ENCOUNTER — Other Ambulatory Visit: Payer: Self-pay | Admitting: Radiology

## 2014-04-04 DIAGNOSIS — R918 Other nonspecific abnormal finding of lung field: Secondary | ICD-10-CM

## 2014-04-05 ENCOUNTER — Other Ambulatory Visit: Payer: Self-pay | Admitting: Radiology

## 2014-04-05 ENCOUNTER — Encounter (HOSPITAL_COMMUNITY): Payer: Self-pay | Admitting: Pharmacy Technician

## 2014-04-08 ENCOUNTER — Telehealth: Payer: Self-pay | Admitting: Pulmonary Disease

## 2014-04-08 NOTE — Telephone Encounter (Signed)
Pt spouse called back. She reports she advised me pt does have BX scheduled for 04/11/14 at 10:45. She does now remember speaking w/ [facility] regarding results. She has no further questions and needed nothing further

## 2014-04-08 NOTE — Telephone Encounter (Signed)
Result Notes    Notes Recorded by Kathee Delton, MD on 04/04/2014 at 8:46 AM Discussed results with pt and wife. Will schedule for biopsy of adrenal glands given his frailty and to stage at same time. Order sent to [facility]  --  Called spoke with pt wife. She reports she does not recall speaking with [facility]. Does not remember any results giving to them. They have no been contacted regarding BX either. She reports she was unaware a BX was suppose to be scheduled. Please advise [facility] thanks

## 2014-04-09 ENCOUNTER — Ambulatory Visit: Payer: Medicare Other | Admitting: Vascular Surgery

## 2014-04-09 ENCOUNTER — Other Ambulatory Visit (HOSPITAL_COMMUNITY): Payer: Medicare Other

## 2014-04-10 ENCOUNTER — Ambulatory Visit: Payer: Medicare Other | Attending: Internal Medicine | Admitting: Occupational Therapy

## 2014-04-10 ENCOUNTER — Other Ambulatory Visit: Payer: Self-pay | Admitting: Radiology

## 2014-04-10 ENCOUNTER — Ambulatory Visit: Payer: Medicare Other

## 2014-04-10 ENCOUNTER — Encounter (HOSPITAL_COMMUNITY): Payer: Self-pay | Admitting: Emergency Medicine

## 2014-04-10 ENCOUNTER — Inpatient Hospital Stay (HOSPITAL_COMMUNITY)
Admission: EM | Admit: 2014-04-10 | Discharge: 2014-04-22 | DRG: 193 | Disposition: E | Payer: Medicare Other | Attending: Internal Medicine | Admitting: Internal Medicine

## 2014-04-10 ENCOUNTER — Emergency Department (HOSPITAL_COMMUNITY): Payer: Medicare Other

## 2014-04-10 ENCOUNTER — Ambulatory Visit: Payer: Medicare Other | Admitting: Physical Therapy

## 2014-04-10 DIAGNOSIS — Z8489 Family history of other specified conditions: Secondary | ICD-10-CM

## 2014-04-10 DIAGNOSIS — R269 Unspecified abnormalities of gait and mobility: Secondary | ICD-10-CM | POA: Diagnosis present

## 2014-04-10 DIAGNOSIS — R471 Dysarthria and anarthria: Secondary | ICD-10-CM | POA: Insufficient documentation

## 2014-04-10 DIAGNOSIS — R2681 Unsteadiness on feet: Secondary | ICD-10-CM | POA: Diagnosis present

## 2014-04-10 DIAGNOSIS — G3183 Dementia with Lewy bodies: Secondary | ICD-10-CM

## 2014-04-10 DIAGNOSIS — N390 Urinary tract infection, site not specified: Secondary | ICD-10-CM | POA: Diagnosis present

## 2014-04-10 DIAGNOSIS — J189 Pneumonia, unspecified organism: Principal | ICD-10-CM | POA: Diagnosis present

## 2014-04-10 DIAGNOSIS — R634 Abnormal weight loss: Secondary | ICD-10-CM | POA: Diagnosis present

## 2014-04-10 DIAGNOSIS — Z66 Do not resuscitate: Secondary | ICD-10-CM | POA: Diagnosis present

## 2014-04-10 DIAGNOSIS — J4489 Other specified chronic obstructive pulmonary disease: Secondary | ICD-10-CM | POA: Diagnosis present

## 2014-04-10 DIAGNOSIS — Z833 Family history of diabetes mellitus: Secondary | ICD-10-CM

## 2014-04-10 DIAGNOSIS — Z5189 Encounter for other specified aftercare: Secondary | ICD-10-CM | POA: Insufficient documentation

## 2014-04-10 DIAGNOSIS — Z96659 Presence of unspecified artificial knee joint: Secondary | ICD-10-CM

## 2014-04-10 DIAGNOSIS — M5137 Other intervertebral disc degeneration, lumbosacral region: Secondary | ICD-10-CM | POA: Diagnosis present

## 2014-04-10 DIAGNOSIS — C3490 Malignant neoplasm of unspecified part of unspecified bronchus or lung: Secondary | ICD-10-CM | POA: Diagnosis present

## 2014-04-10 DIAGNOSIS — D649 Anemia, unspecified: Secondary | ICD-10-CM | POA: Diagnosis present

## 2014-04-10 DIAGNOSIS — G20 Parkinson's disease: Secondary | ICD-10-CM | POA: Insufficient documentation

## 2014-04-10 DIAGNOSIS — M51379 Other intervertebral disc degeneration, lumbosacral region without mention of lumbar back pain or lower extremity pain: Secondary | ICD-10-CM | POA: Diagnosis present

## 2014-04-10 DIAGNOSIS — E860 Dehydration: Secondary | ICD-10-CM

## 2014-04-10 DIAGNOSIS — I469 Cardiac arrest, cause unspecified: Secondary | ICD-10-CM | POA: Diagnosis not present

## 2014-04-10 DIAGNOSIS — C786 Secondary malignant neoplasm of retroperitoneum and peritoneum: Secondary | ICD-10-CM | POA: Diagnosis present

## 2014-04-10 DIAGNOSIS — N179 Acute kidney failure, unspecified: Secondary | ICD-10-CM | POA: Diagnosis present

## 2014-04-10 DIAGNOSIS — Z888 Allergy status to other drugs, medicaments and biological substances status: Secondary | ICD-10-CM

## 2014-04-10 DIAGNOSIS — R1900 Intra-abdominal and pelvic swelling, mass and lump, unspecified site: Secondary | ICD-10-CM

## 2014-04-10 DIAGNOSIS — Z886 Allergy status to analgesic agent status: Secondary | ICD-10-CM

## 2014-04-10 DIAGNOSIS — R1314 Dysphagia, pharyngoesophageal phase: Secondary | ICD-10-CM | POA: Diagnosis present

## 2014-04-10 DIAGNOSIS — I10 Essential (primary) hypertension: Secondary | ICD-10-CM | POA: Diagnosis present

## 2014-04-10 DIAGNOSIS — Q762 Congenital spondylolisthesis: Secondary | ICD-10-CM

## 2014-04-10 DIAGNOSIS — Z79899 Other long term (current) drug therapy: Secondary | ICD-10-CM

## 2014-04-10 DIAGNOSIS — I714 Abdominal aortic aneurysm, without rupture, unspecified: Secondary | ICD-10-CM | POA: Diagnosis present

## 2014-04-10 DIAGNOSIS — R918 Other nonspecific abnormal finding of lung field: Secondary | ICD-10-CM | POA: Diagnosis present

## 2014-04-10 DIAGNOSIS — R1310 Dysphagia, unspecified: Secondary | ICD-10-CM | POA: Diagnosis present

## 2014-04-10 DIAGNOSIS — C7970 Secondary malignant neoplasm of unspecified adrenal gland: Secondary | ICD-10-CM | POA: Diagnosis present

## 2014-04-10 DIAGNOSIS — R4182 Altered mental status, unspecified: Secondary | ICD-10-CM | POA: Diagnosis present

## 2014-04-10 DIAGNOSIS — R222 Localized swelling, mass and lump, trunk: Secondary | ICD-10-CM

## 2014-04-10 DIAGNOSIS — F17200 Nicotine dependence, unspecified, uncomplicated: Secondary | ICD-10-CM | POA: Diagnosis present

## 2014-04-10 DIAGNOSIS — R627 Adult failure to thrive: Secondary | ICD-10-CM | POA: Diagnosis present

## 2014-04-10 DIAGNOSIS — Z9181 History of falling: Secondary | ICD-10-CM

## 2014-04-10 DIAGNOSIS — E279 Disorder of adrenal gland, unspecified: Secondary | ICD-10-CM | POA: Diagnosis present

## 2014-04-10 DIAGNOSIS — R599 Enlarged lymph nodes, unspecified: Secondary | ICD-10-CM | POA: Diagnosis present

## 2014-04-10 DIAGNOSIS — F0280 Dementia in other diseases classified elsewhere without behavioral disturbance: Secondary | ICD-10-CM | POA: Diagnosis present

## 2014-04-10 DIAGNOSIS — IMO0002 Reserved for concepts with insufficient information to code with codable children: Secondary | ICD-10-CM

## 2014-04-10 DIAGNOSIS — G20A1 Parkinson's disease without dyskinesia, without mention of fluctuations: Secondary | ICD-10-CM | POA: Insufficient documentation

## 2014-04-10 DIAGNOSIS — J449 Chronic obstructive pulmonary disease, unspecified: Secondary | ICD-10-CM | POA: Diagnosis present

## 2014-04-10 DIAGNOSIS — Z515 Encounter for palliative care: Secondary | ICD-10-CM

## 2014-04-10 DIAGNOSIS — E43 Unspecified severe protein-calorie malnutrition: Secondary | ICD-10-CM | POA: Diagnosis present

## 2014-04-10 HISTORY — DX: Chronic obstructive pulmonary disease, unspecified: J44.9

## 2014-04-10 LAB — COMPREHENSIVE METABOLIC PANEL
ALT: 7 U/L (ref 0–53)
AST: 17 U/L (ref 0–37)
Albumin: 2.7 g/dL — ABNORMAL LOW (ref 3.5–5.2)
Alkaline Phosphatase: 99 U/L (ref 39–117)
BILIRUBIN TOTAL: 0.5 mg/dL (ref 0.3–1.2)
BUN: 40 mg/dL — AB (ref 6–23)
CALCIUM: 11.1 mg/dL — AB (ref 8.4–10.5)
CHLORIDE: 99 meq/L (ref 96–112)
CO2: 20 meq/L (ref 19–32)
CREATININE: 2.09 mg/dL — AB (ref 0.50–1.35)
GFR calc Af Amer: 33 mL/min — ABNORMAL LOW (ref >90)
GFR, EST NON AFRICAN AMERICAN: 29 mL/min — AB (ref >90)
Glucose, Bld: 82 mg/dL (ref 70–99)
Potassium: 5.1 mEq/L (ref 3.7–5.3)
Sodium: 138 mEq/L (ref 137–147)
Total Protein: 8.6 g/dL — ABNORMAL HIGH (ref 6.0–8.3)

## 2014-04-10 LAB — APTT: APTT: 48 s — AB (ref 24–37)

## 2014-04-10 LAB — CBC
HEMATOCRIT: 32.1 % — AB (ref 39.0–52.0)
Hemoglobin: 10.1 g/dL — ABNORMAL LOW (ref 13.0–17.0)
MCH: 25.4 pg — AB (ref 26.0–34.0)
MCHC: 31.5 g/dL (ref 30.0–36.0)
MCV: 80.9 fL (ref 78.0–100.0)
Platelets: 431 10*3/uL — ABNORMAL HIGH (ref 150–400)
RBC: 3.97 MIL/uL — AB (ref 4.22–5.81)
RDW: 16.5 % — ABNORMAL HIGH (ref 11.5–15.5)
WBC: 16.5 10*3/uL — ABNORMAL HIGH (ref 4.0–10.5)

## 2014-04-10 LAB — URINALYSIS, ROUTINE W REFLEX MICROSCOPIC
Glucose, UA: NEGATIVE mg/dL
Hgb urine dipstick: NEGATIVE
KETONES UR: 15 mg/dL — AB
Leukocytes, UA: NEGATIVE
NITRITE: NEGATIVE
Protein, ur: 30 mg/dL — AB
Specific Gravity, Urine: 1.024 (ref 1.005–1.030)
UROBILINOGEN UA: 1 mg/dL (ref 0.0–1.0)
pH: 5 (ref 5.0–8.0)

## 2014-04-10 LAB — URINE MICROSCOPIC-ADD ON

## 2014-04-10 LAB — TROPONIN I: Troponin I: <0.3 ng/mL (ref <0.30)

## 2014-04-10 LAB — PROTIME-INR
INR: 1.23 (ref 0.00–1.49)
INR: 1.34 (ref 0.00–1.49)
PROTHROMBIN TIME: 15.2 s (ref 11.6–15.2)
PROTHROMBIN TIME: 16.3 s — AB (ref 11.6–15.2)

## 2014-04-10 LAB — CBG MONITORING, ED: Glucose-Capillary: 79 mg/dL (ref 70–99)

## 2014-04-10 MED ORDER — ALUM & MAG HYDROXIDE-SIMETH 200-200-20 MG/5ML PO SUSP: 30.0000 mL | Freq: Four times a day (QID) | ORAL | Status: DC | PRN

## 2014-04-10 MED ORDER — HYDROCODONE-ACETAMINOPHEN 5-325 MG PO TABS
1.0000 | ORAL_TABLET | Freq: Four times a day (QID) | ORAL | Status: DC | PRN
  Administered 2014-04-11: 1 via ORAL
  Filled 2014-04-10: qty 1

## 2014-04-10 MED ORDER — ENOXAPARIN SODIUM 30 MG/0.3ML ~~LOC~~ SOLN
30.0000 mg | SUBCUTANEOUS | Status: DC
  Administered 2014-04-10 – 2014-04-12 (×3): 30 mg via SUBCUTANEOUS
  Filled 2014-04-10 (×4): qty 0.3

## 2014-04-10 MED ORDER — ONDANSETRON HCL 4 MG PO TABS
4.0000 mg | ORAL_TABLET | Freq: Four times a day (QID) | ORAL | Status: DC | PRN
Start: 2014-04-10 — End: 2014-04-13

## 2014-04-10 MED ORDER — SODIUM CHLORIDE 0.9 % IJ SOLN
3.0000 mL | Freq: Two times a day (BID) | INTRAMUSCULAR | Status: DC
  Administered 2014-04-10 – 2014-04-13 (×5): 3 mL via INTRAVENOUS

## 2014-04-10 MED ORDER — BISACODYL 10 MG RE SUPP: 10.0000 mg | Freq: Every day | RECTAL | Status: DC | PRN

## 2014-04-10 MED ORDER — SODIUM CHLORIDE 0.9 % IV SOLN
Freq: Once | INTRAVENOUS | Status: AC
  Administered 2014-04-11: 02:00:00 via INTRAVENOUS

## 2014-04-10 MED ORDER — SODIUM CHLORIDE 0.9 % IV SOLN
INTRAVENOUS | Status: AC
  Administered 2014-04-10: 15:00:00 via INTRAVENOUS

## 2014-04-10 MED ORDER — SODIUM CHLORIDE 0.9 % IV SOLN: Freq: Once | INTRAVENOUS | Status: DC

## 2014-04-10 MED ORDER — ACETAMINOPHEN 325 MG PO TABS: 650.0000 mg | ORAL_TABLET | Freq: Four times a day (QID) | ORAL | Status: DC | PRN

## 2014-04-10 MED ORDER — PIPERACILLIN-TAZOBACTAM 3.375 G IVPB 30 MIN
3.3750 g | INTRAVENOUS | Status: AC
  Administered 2014-04-10: 3.375 g via INTRAVENOUS
  Filled 2014-04-10: qty 50

## 2014-04-10 MED ORDER — OXYCODONE HCL 5 MG PO TABS
5.0000 mg | ORAL_TABLET | ORAL | Status: DC | PRN
  Administered 2014-04-10 – 2014-04-12 (×6): 5 mg via ORAL
  Filled 2014-04-10 (×6): qty 1

## 2014-04-10 MED ORDER — CARBIDOPA-LEVODOPA ER 25-100 MG PO TBCR
2.0000 | EXTENDED_RELEASE_TABLET | Freq: Three times a day (TID) | ORAL | Status: DC
  Administered 2014-04-10 – 2014-04-12 (×7): 2 via ORAL
  Filled 2014-04-10 (×13): qty 2

## 2014-04-10 MED ORDER — PIPERACILLIN-TAZOBACTAM 3.375 G IVPB
3.3750 g | Freq: Three times a day (TID) | INTRAVENOUS | Status: DC
  Administered 2014-04-10 – 2014-04-13 (×9): 3.375 g via INTRAVENOUS
  Filled 2014-04-10 (×11): qty 50

## 2014-04-10 MED ORDER — TAMSULOSIN HCL 0.4 MG PO CAPS
0.4000 mg | ORAL_CAPSULE | Freq: Every day | ORAL | Status: DC
  Administered 2014-04-10: 0.4 mg via ORAL
  Filled 2014-04-10 (×2): qty 1

## 2014-04-10 MED ORDER — ONDANSETRON HCL 4 MG/2ML IJ SOLN: 4.0000 mg | Freq: Four times a day (QID) | INTRAMUSCULAR | Status: DC | PRN

## 2014-04-10 NOTE — ED Notes (Signed)
Pt has been given urinal and instructions on obtaining urine specimen wife stated pt has not been drinking fluids over the past couple of days

## 2014-04-10 NOTE — ED Notes (Signed)
Per wife, states husband normally very talkative-has not been himself for a "few" days-not as interactive-went to neuro office and was told to go to ED for eval-patient nonverbal, although following simple commands

## 2014-04-10 NOTE — ED Notes (Signed)
Initial contact-per wife patient has had "garbled speech" since April for no defined reason. Does have hx Parkinson's. Pt has prolonged gaze. Able to follow commands. Able to move all extremities. Slight deficit and weakness on left side. Wife says that patient has had decreased appetite for the past week. No hx strokes. Not taking anticoagulants. No new medications started. Does endorse a fall in the last 6 months. No precipitating factors.

## 2014-04-10 NOTE — ED Notes (Signed)
On neurological reassessment-patient has delayed response but is able to state his name, the month, and the president's name. Pupils are still round, reactive to light and equal at this time.

## 2014-04-10 NOTE — Progress Notes (Signed)
ANTIBIOTIC CONSULT NOTE - INITIAL  Pharmacy Consult for Zosyn Indication: Post-obstructive pneumonia  Allergies  Allergen Reactions  . Aspirin Adult Low [Aspirin] Other (See Comments)    GI BLEED/ulcers  . Ibuprofen     Irritates stomach    Patient Measurements:     Vital Signs: Temp: 97.5 F (36.4 C) (05/20 1333) Temp src: Oral (05/20 1333) BP: 111/75 mmHg (05/20 1333) Pulse Rate: 115 (05/20 1126) Intake/Output from previous day:   Intake/Output from this shift: Total I/O In: -  Out: 20 [Urine:20]  Labs:  Recent Labs  03/29/2014 1138  WBC 16.5*  HGB 10.1*  PLT 431*  CREATININE 2.09*   The CrCl is unknown because both a height and weight (above a minimum accepted value) are required for this calculation. No results found for this basename: VANCOTROUGH, VANCOPEAK, VANCORANDOM, GENTTROUGH, GENTPEAK, GENTRANDOM, TOBRATROUGH, TOBRAPEAK, TOBRARND, AMIKACINPEAK, AMIKACINTROU, AMIKACIN,  in the last 72 hours   Microbiology: No results found for this or any previous visit (from the past 720 hour(s)).  Medical History: Past Medical History  Diagnosis Date  . Parkinson's disease   . Hypertension   . AAA (abdominal aortic aneurysm)   . Spondylolisthesis   . Lumbar degenerative disc disease   . Lung mass     Medications:   (Not in a hospital admission) Anti-infectives   None     Assessment: 80yo M from home with AMS and rib pain. Recently diagnosed with a lung mass. CXR showed post-obstructive pneumonia. Pharmacy is asked to dose Zosyn.   Recent weight 79kg.  SCr elevated at 2.09, CrCl ~24ml/min/1.73m2. Likely to improve with hydration.  Goal of Therapy:  Appropriate antibiotic dosing for renal function; eradication of infection  Plan:   Zosyn 3.375g IV Q8H infused over 4hrs.  Follow up renal fxn and culture results.  Romeo Rabon, PharmD, pager [phone]. 04/15/2014,2:32 PM.

## 2014-04-10 NOTE — ED Provider Notes (Signed)
CSN: [id number]     Arrival date & time 04/03/2014  1119 History   First MD Initiated Contact with Patient 04/02/2014 1130     Chief Complaint  Patient presents with  . AMS      (Consider location/radiation/quality/duration/timing/severity/associated sxs/prior Treatment) HPI Comments: Patient brought to the ER for evaluation of altered mental status. Patient comes from home where he resides with his wife. Wife provides all the information because the patient is not communicative currently. Level V Caveat because of mental status.  Patient reportedly has had a progressive decline over the last 2 days. He normally is alert and interactive, but does not walk because of his Parkinson's. Patient is unable to answer questions appropriately. Wife reports that he has not been making any sense. He has not been eating. She has been trying to get him to drink, but he has not been taking much in.  Patient had a fall several weeks ago. He fractured his right arm in the fall. Since then he has been complaining of some left-sided rib pain. No cough. Wife has not noticed any fever. There has not been any nausea, vomiting or diarrhea.  The history is provided by the spouse.    Past Medical History  Diagnosis Date  . Parkinson's disease   . Hypertension   . AAA (abdominal aortic aneurysm)   . Spondylolisthesis   . Lumbar degenerative disc disease   . Lung mass    Past Surgical History  Procedure Laterality Date  . Total knee arthroplasty      bilateral  . Hernia repair  7 years ago  . Joint replacement    . Esophagogastroduodenoscopy N/A 10/30/2013    Procedure: ESOPHAGOGASTRODUODENOSCOPY (EGD);  Surgeon: Lear Ng, MD;  Location: [facility];  Service: Endoscopy;  Laterality: N/A;   Family History  Problem Relation Age of Onset  . Diabetes Father   . Diabetes Brother   . Cancer Brother   . Parkinsonism Mother    History  Substance Use Topics  . Smoking status: Current Some Day  Smoker    Types: Cigars  . Smokeless tobacco: Never Used     Comment: 2-3 cigars daily  . Alcohol Use: 0.6 oz/week    1 Glasses of wine per week     Comment: 1 glass of wine at night    Review of Systems  Unable to perform ROS: Mental status change      Allergies  Aspirin adult low and Ibuprofen  Home Medications   Prior to Admission medications   Medication Sig Start Date End Date Taking? Authorizing Provider  acetaminophen (TYLENOL) 325 MG tablet Take 650 mg by mouth every 6 (six) hours as needed for mild pain. For pain    Historical Provider, MD  amLODipine (NORVASC) 5 MG tablet Take 5 mg by mouth every morning.     Historical Provider, MD  HYDROcodone-acetaminophen (NORCO/VICODIN) 5-325 MG per tablet Take 1 tablet by mouth every 6 (six) hours as needed for moderate pain.    Historical Provider, MD  pantoprazole (PROTONIX) 40 MG tablet Take 40 mg by mouth daily as needed (heart burn).    Historical Provider, MD   BP 111/75  Pulse 115  Temp(Src) 97.5 F (36.4 C) (Oral)  Resp 20  SpO2 95% Physical Exam  Constitutional: He is oriented to person, place, and time. He appears well-developed and well-nourished. No distress.  HENT:  Head: Normocephalic and atraumatic.  Right Ear: Hearing normal.  Left Ear: Hearing normal.  Nose:  Nose normal.  Mouth/Throat: Oropharynx is clear and moist. Mucous membranes are dry.  Eyes: Conjunctivae and EOM are normal. Pupils are equal, round, and reactive to light.  Neck: Normal range of motion. Neck supple.  Cardiovascular: Regular rhythm, S1 normal and S2 normal.  Tachycardia present.  Exam reveals no gallop and no friction rub.   No murmur heard. Pulmonary/Chest: Effort normal and breath sounds normal. No respiratory distress. He exhibits no tenderness.  Abdominal: Soft. Normal appearance and bowel sounds are normal. There is no hepatosplenomegaly. There is no tenderness. There is no rebound, no guarding, no tenderness at McBurney's point  and negative Murphy's sign. No hernia.  Musculoskeletal: Normal range of motion.  Neurological: He is alert and oriented to person, place, and time. He has normal strength. No cranial nerve deficit or sensory deficit. Coordination normal. GCS eye subscore is 4. GCS verbal subscore is 5. GCS motor subscore is 6.  Skin: Skin is warm, dry and intact. No rash noted. No cyanosis.  Psychiatric: He has a normal mood and affect. His speech is normal and behavior is normal. Thought content normal.    ED Course  Procedures (including critical care time) Labs Review Labs Reviewed  CBC - Abnormal; Notable for the following:    WBC 16.5 (*)    RBC 3.97 (*)    Hemoglobin 10.1 (*)    HCT 32.1 (*)    MCH 25.4 (*)    RDW 16.5 (*)    Platelets 431 (*)    All other components within normal limits  COMPREHENSIVE METABOLIC PANEL - Abnormal; Notable for the following:    BUN 40 (*)    Creatinine, Ser 2.09 (*)    Calcium 11.1 (*)    Total Protein 8.6 (*)    Albumin 2.7 (*)    GFR calc non Af Amer 29 (*)    GFR calc Af Amer 33 (*)    All other components within normal limits  URINALYSIS, ROUTINE W REFLEX MICROSCOPIC - Abnormal; Notable for the following:    Color, Urine AMBER (*)    APPearance CLOUDY (*)    Bilirubin Urine LARGE (*)    Ketones, ur 15 (*)    Protein, ur 30 (*)    All other components within normal limits  URINE MICROSCOPIC-ADD ON - Abnormal; Notable for the following:    Squamous Epithelial / LPF FEW (*)    Bacteria, UA MANY (*)    Casts HYALINE CASTS (*)    All other components within normal limits  URINE CULTURE  CULTURE, BLOOD (ROUTINE X 2)  CULTURE, BLOOD (ROUTINE X 2)  PROTIME-INR  TROPONIN I  CBG MONITORING, ED    Imaging Review Dg Ribs Unilateral W/chest Left  04/02/2014   CLINICAL DATA:  Recent fall. Left rib pain. Mental status change. Evaluate for pneumonia.  EXAM: LEFT RIBS AND CHEST - 3+ VIEW  COMPARISON:  PET-CT 04/01/2014  FINDINGS: The cardiac silhouette is  within normal limits for size. Thoracic aorta is mildly tortuous. A large mass is again seen in the left mid lung adjacent to the hilum. Abnormal parenchymal opacity extends inferiorly and laterally, partially obscuring the left heart border and corresponding to lingular consolidation on PET-CT. Interstitial markings are slightly prominent in the right lung without evidence of right lung airspace consolidation. No pleural effusion or pneumothorax is identified. No acute rib fracture is identified.  IMPRESSION: 1. Large left lung mass with persistent postobstructive pneumonia in the lingula. 2. No evidence of acute disease in  the right lung. 3. No acute rib fracture identified.   Electronically Signed   By: Logan Bores   On: 04/19/2014 13:19   Ct Head Wo Contrast  04/13/2014   CLINICAL DATA:  Mental status change. Findings consistent with primary bronchogenic carcinoma on recent PET-CT.  EXAM: CT HEAD WITHOUT CONTRAST  TECHNIQUE: Contiguous axial images were obtained from the base of the skull through the vertex without intravenous contrast.  COMPARISON:  PET-CT 04/01/2014  FINDINGS: No acute intracranial abnormality is identified. Specifically, no intra or extra-axial hemorrhage, mass lesion, mass effect, hydrocephalus, or evidence of acute cortically based infarction.  The visualized paranasal sinuses and mastoid air cells are clear the skull is intact. No suspicious osseous lesions.  IMPRESSION: No acute intracranial abnormality.   Electronically Signed   By: Curlene Dolphin M.D.   On: 04/04/2014 12:49     EKG Interpretation None       Date: 04/07/2014  Rate: 116  Rhythm: sinus tachycardia  QRS Axis: left  Intervals: normal  ST/T Wave abnormalities: nonspecific ST/T changes  Conduction Disutrbances:right bundle branch block  Narrative Interpretation:   Old EKG Reviewed: Tachycardia, nonspecific changes, right bundle-branch block new since last ECG   MDM   Final diagnoses:  Postobstructive  pneumonia ; chest mass ; dehydration     To the ER from home for progressive decline. Patient has become more weak, lethargic over the period of at least 2 days. He has not been taking anything in by mouth. He has had a significant change in his mental status, now cannot answer questions appropriately. Patient has a history of Parkinson's. He was also diagnosed recently with a mass in his chest causing postobstructive pneumonia. He is currently undergoing evaluation for this. It is felt that he likely has metastatic cancer. This is the cause of the patient's left-sided chest pain. Wife indicated that he had a recent fall with concern of a minor injury to the ribs, but the pain is on the left side where the mass is. This is unchanged from previously, will initiate antibiotic coverage with Zosyn.  Patient is having signs of dehydration. He has not been taking much in by mouth and his creatinine is mildly elevated above his baseline. Will require gentle hydration. Urinalysis equivocal, bloating or culture is pending. So she took her for UTI at present. He is not hypoxic and is not hypotensive. No concern for sepsis at this time.   Orpah Greek, MD 03/27/2014 1425

## 2014-04-10 NOTE — ED Notes (Signed)
Patient transported to CT 

## 2014-04-10 NOTE — ED Notes (Signed)
Wife will take all belongings home tonight.

## 2014-04-10 NOTE — H&P (Signed)
Jose Vega is an 80 y.o. male.   Chief Complaint: weakness HPI:  Jose Vega is a 80 year old man who is well-known to me as I service his primary care physician. He was brought to the emergency room today because of progressive decline in overall function over the past few days. He has been eating and drinking very little in the past 2 weeks and has been losing weight. He states that he just is not hungry, and that he does not have problems swallowing per se. He has also had more difficult to comprehend speech over the past few days and extreme difficulty ambulating. His wife transports him around their home with a wheelchair and he is able to do transfers to the toilet or bed. He also continues to have a dry cough without significant dyspnea at rest on room air. Over the past one to 2 months he has been diagnosed with a left upper lobe lung mass with recent postobstructive pneumonia. 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 Plans were underway for a CT-guided biopsy of the lung mass tomorrow. His wife is exhausted from trying to care for him in his weakened state, and brought him to the emergency room for evaluation. Earlier this spring he had a fall that resulted in a right humerus fracture and he has had a fall that caused several left-sided rib fractures. He has a history of Parkinson's disease with fairly rapid progression of motor deficits, for which his local neurologist was planning on obtaining a second opinion from a [facility] neurologist. He also has a history of a significant upper GI bleed from a duodenal ulcer in December 2014. I discussed with he and his wife their wishes should he have cardiac arrest, and they both would prefer a DO NOT RESUSCITATE status. Past Medical History  Diagnosis Date  . Parkinson's disease   . Hypertension   . AAA (abdominal aortic aneurysm)   . Spondylolisthesis    . Lumbar degenerative disc disease   . Lung mass   . COPD (chronic obstructive pulmonary disease)     Medications Prior to Admission  Medication Sig Dispense Refill  . acetaminophen (TYLENOL) 325 MG tablet Take 650 mg by mouth every 6 (six) hours as needed for mild pain. For pain      . HYDROcodone-acetaminophen (NORCO/VICODIN) 5-325 MG per tablet Take 1 tablet by mouth every 6 (six) hours as needed for moderate pain.        ADDITIONAL HOME MEDICATIONS: No additional home medications  PHYSICIANS INVOLVED IN CARE: Danton Sewer (pulmonary), Star Age (neurology), Ronald Lobo (GI), Leanna Battles (PCP)  Past Surgical History  Procedure Laterality Date  . Total knee arthroplasty      bilateral  . Hernia repair  7 years ago  . Joint replacement    . Esophagogastroduodenoscopy N/A 10/30/2013    Procedure: ESOPHAGOGASTRODUODENOSCOPY (EGD);  Surgeon: Lear Ng, MD;  Location: [facility];  Service: Endoscopy;  Laterality: N/A;    Family History  Problem Relation Age of Onset  . Diabetes Father   . Diabetes Brother   . Cancer Brother   . Parkinsonism Mother      Social History:  reports that he has been smoking Cigars.  He has never used smokeless tobacco. He reports that he drinks about .6 ounces of alcohol per week. He reports that he does not use illicit drugs.  Allergies:  Allergies  Allergen  Reactions  . Aspirin Adult Low [Aspirin] Other (See Comments)    GI BLEED/ulcers  . Ibuprofen     Irritates stomach     ROS: anemia, ankle swelling, arthritis, emphysema, high blood pressure, tobacco use, weight loss and Parkinson's disease, idiopathic peripheral neuropathy, gait instability, dysphagia, Left lung mass, vitamin B12 deficiency, hyperlipidemia  PHYSICAL EXAM: Blood pressure 109/73, pulse 83, temperature 98.8 F (37.1 C), temperature source Oral, resp. rate 18, height 5' 9"  (1.753 m), weight 73.7 kg (162 lb 7.7 oz), SpO2 97.00%. In general, the  Jose Vega is a frail elderly white man who was in no apparent distress while lying partially upright in bed. His voice is very muffled and difficult to comprehend. His oral hygiene is also poor. HEENT exam is otherwise significant for bitemporal wasting, neck was supple without jugular venous distention or carotid bruit, chest had left basilar crackles, heart had a regular rate and rhythm without significant murmur or gallop, abdomen had normal bowel sounds and no hepatosplenomegaly or tenderness, extremities have bilateral trace leg edema with bilateral 1+ pedal pulses. Neurologic exam: He was alert and oriented x3, cranial nerves II through XII were within normal limits, he had intact light touch sensation, he had 4/5 strength in all extremities. Had normal bilateral finger to nose testing, gait was not assessed.  Results for orders placed during the hospital encounter of 03/29/2014 (from the past 48 hour(s))  CBC     Status: Abnormal   Collection Time    04/20/2014 11:38 AM      Result Value Ref Range   WBC 16.5 (*) 4.0 - 10.5 K/uL   RBC 3.97 (*) 4.22 - 5.81 MIL/uL   Hemoglobin 10.1 (*) 13.0 - 17.0 g/dL   HCT 32.1 (*) 39.0 - 52.0 %   MCV 80.9  78.0 - 100.0 fL   MCH 25.4 (*) 26.0 - 34.0 pg   MCHC 31.5  30.0 - 36.0 g/dL   RDW 16.5 (*) 11.5 - 15.5 %   Platelets 431 (*) 150 - 400 K/uL  COMPREHENSIVE METABOLIC PANEL     Status: Abnormal   Collection Time    04/12/2014 11:38 AM      Result Value Ref Range   Sodium 138  137 - 147 mEq/L   Potassium 5.1  3.7 - 5.3 mEq/L   Chloride 99  96 - 112 mEq/L   CO2 20  19 - 32 mEq/L   Glucose, Bld 82  70 - 99 mg/dL   BUN 40 (*) 6 - 23 mg/dL   Creatinine, Ser 2.09 (*) 0.50 - 1.35 mg/dL   Calcium 11.1 (*) 8.4 - 10.5 mg/dL   Total Protein 8.6 (*) 6.0 - 8.3 g/dL   Albumin 2.7 (*) 3.5 - 5.2 g/dL   AST 17  0 - 37 U/L   ALT 7  0 - 53 U/L   Alkaline Phosphatase 99  39 - 117 U/L   Total Bilirubin 0.5  0.3 - 1.2 mg/dL   GFR calc non Af Amer 29 (*) >90 mL/min   GFR  calc Af Amer 33 (*) >90 mL/min   Comment: (NOTE)     The eGFR has been calculated using the CKD EPI equation.     This calculation has not been validated in all clinical situations.     eGFR's persistently <90 mL/min signify possible Chronic Kidney     Disease.  PROTIME-INR     Status: None   Collection Time    04/04/2014 11:38 AM  Result Value Ref Range   Prothrombin Time 15.2  11.6 - 15.2 seconds   INR 1.23  0.00 - 1.49  TROPONIN I     Status: None   Collection Time    04/19/2014 11:38 AM      Result Value Ref Range   Troponin I <0.30  <0.30 ng/mL   Comment:            Due to the release kinetics of cTnI,     a negative result within the first hours     of the onset of symptoms does not rule out     myocardial infarction with certainty.     If myocardial infarction is still suspected,     repeat the test at appropriate intervals.  CBG MONITORING, ED     Status: None   Collection Time    03/30/2014 11:46 AM      Result Value Ref Range   Glucose-Capillary 79  70 - 99 mg/dL   Comment 1 Notify RN    URINALYSIS, ROUTINE W REFLEX MICROSCOPIC     Status: Abnormal   Collection Time    04/03/2014  1:16 PM      Result Value Ref Range   Color, Urine AMBER (*) YELLOW   Comment: BIOCHEMICALS MAY BE AFFECTED BY COLOR   APPearance CLOUDY (*) CLEAR   Specific Gravity, Urine 1.024  1.005 - 1.030   pH 5.0  5.0 - 8.0   Glucose, UA NEGATIVE  NEGATIVE mg/dL   Hgb urine dipstick NEGATIVE  NEGATIVE   Bilirubin Urine LARGE (*) NEGATIVE   Ketones, ur 15 (*) NEGATIVE mg/dL   Protein, ur 30 (*) NEGATIVE mg/dL   Urobilinogen, UA 1.0  0.0 - 1.0 mg/dL   Nitrite NEGATIVE  NEGATIVE   Leukocytes, UA NEGATIVE  NEGATIVE  URINE MICROSCOPIC-ADD ON     Status: Abnormal   Collection Time    03/26/2014  1:16 PM      Result Value Ref Range   Squamous Epithelial / LPF FEW (*) RARE   RBC / HPF 3-6  <3 RBC/hpf   Bacteria, UA MANY (*) RARE   Casts HYALINE CASTS (*) NEGATIVE   Urine-Other MUCOUS PRESENT      Comment: AMORPHOUS URATES/PHOSPHATES   Dg Ribs Unilateral W/chest Left  04/13/2014   CLINICAL DATA:  Recent fall. Left rib pain. Mental status change. Evaluate for pneumonia.  EXAM: LEFT RIBS AND CHEST - 3+ VIEW  COMPARISON:  PET-CT 04/01/2014  FINDINGS: The cardiac silhouette is within normal limits for size. Thoracic aorta is mildly tortuous. A large mass is again seen in the left mid lung adjacent to the hilum. Abnormal parenchymal opacity extends inferiorly and laterally, partially obscuring the left heart border and corresponding to lingular consolidation on PET-CT. Interstitial markings are slightly prominent in the right lung without evidence of right lung airspace consolidation. No pleural effusion or pneumothorax is identified. No acute rib fracture is identified.  IMPRESSION: 1. Large left lung mass with persistent postobstructive pneumonia in the lingula. 2. No evidence of acute disease in the right lung. 3. No acute rib fracture identified.   Electronically Signed   By: Logan Bores   On: 03/27/2014 13:19   Ct Head Wo Contrast  04/01/2014   CLINICAL DATA:  Mental status change. Findings consistent with primary bronchogenic carcinoma on recent PET-CT.  EXAM: CT HEAD WITHOUT CONTRAST  TECHNIQUE: Contiguous axial images were obtained from the base of the skull through the vertex without intravenous contrast.  COMPARISON:  PET-CT 04/01/2014  FINDINGS: No acute intracranial abnormality is identified. Specifically, no intra or extra-axial hemorrhage, mass lesion, mass effect, hydrocephalus, or evidence of acute cortically based infarction.  The visualized paranasal sinuses and mastoid air cells are clear the skull is intact. No suspicious osseous lesions.  IMPRESSION: No acute intracranial abnormality.   Electronically Signed   By: Curlene Dolphin M.D.   On: 03/31/2014 12:49     Assessment/Plan #1 Pneumonia: He has postobstructive pneumonia with leukocytosis. He is clinically stable without  significant dyspnea. He most likely has bronchogenic carcinoma given the constellation of a lung mass and bilateral adrenal masses that are quite active on PET scanning. I will discuss with his pulmonologist timing for CT-guided biopsy. We will continue broad-spectrum antibiotics with vancomycin and Zosyn for now. #2 Dysphagia: unclear severity with associated weight loss, and we will obtain a modified barium swallow test and request a speech pathologist evaluation #3 Gait Instability: moderately severe and likely due to parkinsons disease. We will request a PT and OT evaluation. #4 Protein Calorie Malnutrition: severe, and we will initiate use of nutrition supplements. I discussed possible PEG tube placement and the continued risk that he would have for aspiration pneumonia with a PEG tube.  #4 Acute Kidney Injury: interval decreased renal function likely due to hypoperfusion, and should improve with allowing his BP to rise and with IVF.   Leanna Battles 04/13/2014, 8:35 PM

## 2014-04-10 NOTE — ED Notes (Signed)
Attempted to call report for patient. RN will call back promptly.

## 2014-04-11 ENCOUNTER — Inpatient Hospital Stay (HOSPITAL_COMMUNITY): Payer: Medicare Other

## 2014-04-11 ENCOUNTER — Ambulatory Visit (HOSPITAL_COMMUNITY): Admission: RE | Admit: 2014-04-11 | Payer: Medicare Other | Source: Ambulatory Visit

## 2014-04-11 ENCOUNTER — Ambulatory Visit (HOSPITAL_COMMUNITY): Payer: Medicare Other

## 2014-04-11 LAB — COMPREHENSIVE METABOLIC PANEL
ALBUMIN: 2.4 g/dL — AB (ref 3.5–5.2)
ALT: <5 U/L (ref 0–53)
AST: 15 U/L (ref 0–37)
Alkaline Phosphatase: 92 U/L (ref 39–117)
BUN: 38 mg/dL — ABNORMAL HIGH (ref 6–23)
CALCIUM: 10.8 mg/dL — AB (ref 8.4–10.5)
CO2: 21 mEq/L (ref 19–32)
Chloride: 101 mEq/L (ref 96–112)
Creatinine, Ser: 1.96 mg/dL — ABNORMAL HIGH (ref 0.50–1.35)
GFR calc Af Amer: 36 mL/min — ABNORMAL LOW (ref >90)
GFR calc non Af Amer: 31 mL/min — ABNORMAL LOW (ref >90)
Glucose, Bld: 75 mg/dL (ref 70–99)
POTASSIUM: 4.8 meq/L (ref 3.7–5.3)
Sodium: 140 mEq/L (ref 137–147)
Total Bilirubin: 0.6 mg/dL (ref 0.3–1.2)
Total Protein: 8 g/dL (ref 6.0–8.3)

## 2014-04-11 LAB — CBC
HCT: 30.2 % — ABNORMAL LOW (ref 39.0–52.0)
Hemoglobin: 9.3 g/dL — ABNORMAL LOW (ref 13.0–17.0)
MCH: 25.3 pg — ABNORMAL LOW (ref 26.0–34.0)
MCHC: 30.8 g/dL (ref 30.0–36.0)
MCV: 82.1 fL (ref 78.0–100.0)
Platelets: 352 10*3/uL (ref 150–400)
RBC: 3.68 MIL/uL — AB (ref 4.22–5.81)
RDW: 16.8 % — ABNORMAL HIGH (ref 11.5–15.5)
WBC: 13.3 10*3/uL — ABNORMAL HIGH (ref 4.0–10.5)

## 2014-04-11 MED ORDER — MIDAZOLAM HCL 2 MG/2ML IJ SOLN
INTRAMUSCULAR | Status: AC
  Filled 2014-04-11: qty 4

## 2014-04-11 MED ORDER — POLYETHYLENE GLYCOL 3350 17 G PO PACK: 17.0000 g | PACK | Freq: Once | ORAL | Status: DC

## 2014-04-11 MED ORDER — FENTANYL CITRATE 0.05 MG/ML IJ SOLN
INTRAMUSCULAR | Status: AC
  Filled 2014-04-11: qty 2

## 2014-04-11 NOTE — Evaluation (Addendum)
SLP Cancellation Note  Patient Details Name: Jose Vega MRN: [id number] DOB: Sep 24, 1934   Cancelled treatment:       Reason Eval/Treat Not Completed:  (pt npo for guided biopsy, will reattempt at another time)  Will plan to complete MBS immediately following guided biopsy if pt can participate.  RN,xray aware.    Luanna Salk, [facility] SLP [phone]

## 2014-04-11 NOTE — Progress Notes (Addendum)
Clinical Social Work Department CLINICAL SOCIAL WORK PLACEMENT NOTE 04/11/2014  Patient:  Jose Vega, Jose Vega  Account Number:  [id number] [place] date:  04/19/2014  Clinical Social Worker:  Sindy Messing, LCSW  Date/time:  04/11/2014 05:00 PM  Clinical Social Work is seeking post-discharge placement for this patient at the following level of care:   [place]   (*CSW will update this form in Epic as items are completed)   04/11/2014  Patient/family provided with [place] Department of Clinical Social Work's list of facilities offering this level of care within the geographic area requested by the patient (or if unable, by the patient's family).  04/11/2014  Patient/family informed of their freedom to choose among providers that offer the needed level of care, that participate in Medicare, Medicaid or managed care program needed by the patient, have an available bed and are willing to accept the patient.  04/11/2014  Patient/family informed of [facility]' ownership interest in [facility], as well as of the fact that they are under no obligation to receive care at this facility.  PASARR submitted to EDS on 04/11/2014 PASARR number received from EDS on 04/11/2014  FL2 transmitted to all facilities in geographic area requested by pt/family on  04/11/2014 FL2 transmitted to all facilities within larger geographic area on 04/12/14  Patient informed that his/her managed care company has contracts with or will negotiate with  certain facilities, including the following:     Patient/family informed of bed offers received:  04/12/14 Patient chooses bed at  Physician recommends and patient chooses bed at    Patient to be transferred to  on   Patient to be transferred to facility by   The following physician request were entered in Epic:   Additional Comments:

## 2014-04-11 NOTE — Procedures (Signed)
Interventional Radiology Procedure Note  Procedure: CT biopsy of LLQ retroperitoneal nodule Complications: None Recommendations: - Path pending  Signed,  Criselda Peaches, MD [facility]

## 2014-04-11 NOTE — Evaluation (Signed)
Physical Therapy Evaluation Patient Details Name: Jose Vega MRN: [id number] DOB: 06-Jun-1934 Today's Date: 04/11/2014   History of Present Illness  Pt was admitted 5/20 with progressive decline/weakness due to left upper lobe lung mass with recent postobstructive pneumonia. Pt with pain in R humerus and reports fall with fx in Nov last year (states Spring per chart review).  PMH significant for Parkinson's, lung mass and COPD.  Lung mass biopsy planned for later today  Clinical Impression  Pt currently with functional limitations due to the deficits listed below (see PT Problem List).  Pt will benefit from skilled PT to increase their independence and safety with mobility to allow discharge to the venue listed below.  Pt with functional decline prior to admission per chart review.  Pt felt unable to ambulate in hallway today.  Recommend ST-SNF upon d/c.     Follow Up Recommendations SNF    Equipment Recommendations  None recommended by PT    Recommendations for Other Services       Precautions / Restrictions Precautions Precautions: Fall Restrictions Weight Bearing Restrictions: No      Mobility  Bed Mobility               General bed mobility comments: up in recliner on arrival  Transfers Overall transfer level: Needs assistance Equipment used: Rolling walker (2 wheeled) Transfers: Sit to/from Stand Sit to Stand: Mod assist         General transfer comment: increased assist with initial stand, posterior LOB requiring assist to correct upon standing, performed twice  Ambulation/Gait Ambulation/Gait assistance: Min assist Ambulation Distance (Feet): 2 Feet Assistive device: Rolling walker (2 wheeled) Gait Pattern/deviations: Step-through pattern;Decreased stride length;Shuffle;Trunk flexed     General Gait Details: pt ambulated 3-4 shuffling steps then stopped, stated he didn't feel he could continue so assisted with small backwards steps back to  recliner  Stairs            Wheelchair Mobility    Modified Rankin (Stroke Patients Only)       Balance Overall balance assessment: Needs assistance         Standing balance support: Bilateral upper extremity supported;During functional activity Standing balance-Leahy Scale: Poor Standing balance comment: pt assisted with using urinal, pt unable to hold urinal due to need for bil UE support with RW                             Pertinent Vitals/Pain R upper arm pain, not rated, RN brought warm packs, activity to tolerance    Home Living Family/patient expects to be discharged to:: Unsure Living Arrangements: Spouse/significant other Available Help at Discharge: Family Type of Home: [place]: One level Home Equipment: Environmental consultant - 4 wheels (rollator)      Prior Function Level of Independence: Needs assistance   Gait / Transfers Assistance Needed: pt reports he usually ambulates with rollator around his home however chart states decline in functioning with spouse pushing w/c and pt transferring     Comments: has been using w/c per chart and doing SPTs; assistance with adls per pt     Hand Dominance        Extremity/Trunk Assessment   Upper Extremity Assessment: Generalized weakness;RUE deficits/detail RUE Deficits / Details: h/o fx in R upper arm.  Pt able to lift approximately 30 degrees; distally wfls, but decreased strength  Lower Extremity Assessment: Generalized weakness      Cervical / Trunk Assessment: Kyphotic  Communication   Communication: Expressive difficulties (difficult to understand, quiet talking)  Cognition Arousal/Alertness: Awake/alert Behavior During Therapy: WFL for tasks assessed/performed Overall Cognitive Status: Within Functional Limits for tasks assessed                      General Comments      Exercises        Assessment/Plan    PT Assessment Patient needs continued PT  services  PT Diagnosis Difficulty walking   PT Problem List Decreased strength;Decreased balance;Decreased activity tolerance;Decreased knowledge of use of DME;Decreased mobility  PT Treatment Interventions DME instruction;Gait training;Functional mobility training;Therapeutic activities;Therapeutic exercise;Patient/family education;Wheelchair mobility training;Balance training   PT Goals (Current goals can be found in the Care Plan section) Acute Rehab PT Goals PT Goal Formulation: With patient Time For Goal Achievement: 04/25/14 Potential to Achieve Goals: Good    Frequency Min 3X/week   Barriers to discharge        Co-evaluation               End of Session Equipment Utilized During Treatment: Gait belt Activity Tolerance: Patient limited by fatigue Patient left: in chair;with call bell/phone within reach Nurse Communication:  (pt assisted with using urinal, RN had assisted pt to recliner)         Time: 5053-9767 PT Time Calculation (min): 18 min   Charges:   PT Evaluation $Initial PT Evaluation Tier I: 1 Procedure PT Treatments $Therapeutic Activity: 8-22 mins   PT G CodesJunius Argyle 04/11/2014, 1:33 PM Carmelia Bake, PT, DPT 04/11/2014 Pager: [phone]

## 2014-04-11 NOTE — Progress Notes (Signed)
Subjective: Feels weak, slept poorly last night, no dyspnea, speech remains muffled, but comprehensible.  Objective: Vital signs in last 24 hours: Temp:  [97.3 F (36.3 C)-98.8 F (37.1 C)] 97.6 F (36.4 C) (05/21 0628) Pulse Rate:  [83-115] 97 (05/21 0628) Resp:  [15-23] 20 (05/21 0628) BP: (91-116)/(63-79) 97/67 mmHg (05/21 0628) SpO2:  [93 %-98 %] 98 % (05/21 0628) Weight:  [73.7 kg (162 lb 7.7 oz)] 73.7 kg (162 lb 7.7 oz) (05/20 1739) Weight change:    Intake/Output from previous day: 05/20 0701 - 05/21 0700 In: -  Out: 245 [Urine:245]   General appearance: alert, cooperative and no distress Resp: clear to auscultation bilaterally Cardio: regular rate and rhythm GI: soft, non-tender; bowel sounds normal; no masses,  no organomegaly Extremities: extremities normal, atraumatic, no cyanosis or edema  Lab Results:  Recent Labs  04/04/2014 1138 04/11/14 0521  WBC 16.5* 13.3*  HGB 10.1* 9.3*  HCT 32.1* 30.2*  PLT 431* 352   BMET  Recent Labs  04/20/2014 1138 04/11/14 0521  NA 138 140  K 5.1 4.8  CL 99 101  CO2 20 21  GLUCOSE 82 75  BUN 40* 38*  CREATININE 2.09* 1.96*  CALCIUM 11.1* 10.8*   CMET CMP     Component Value Date/Time   NA 140 04/11/2014 0521   K 4.8 04/11/2014 0521   CL 101 04/11/2014 0521   CO2 21 04/11/2014 0521   GLUCOSE 75 04/11/2014 0521   BUN 38* 04/11/2014 0521   CREATININE 1.96* 04/11/2014 0521   CREATININE 1.26 09/24/2013 1322   CALCIUM 10.8* 04/11/2014 0521   PROT 8.0 04/11/2014 0521   ALBUMIN 2.4* 04/11/2014 0521   AST 15 04/11/2014 0521   ALT <5 04/11/2014 0521   ALKPHOS 92 04/11/2014 0521   BILITOT 0.6 04/11/2014 0521   GFRNONAA 31* 04/11/2014 0521   GFRAA 36* 04/11/2014 0521    CBG (last 3)   Recent Labs  04/17/2014 1146  GLUCAP 79    INR RESULTS:   Lab Results  Component Value Date   INR 1.34 03/26/2014   INR 1.23 04/18/2014     Studies/Results: Dg Ribs Unilateral W/chest Left  04/21/2014   CLINICAL DATA:  Recent fall.  Left rib pain. Mental status change. Evaluate for pneumonia.  EXAM: LEFT RIBS AND CHEST - 3+ VIEW  COMPARISON:  PET-CT 04/01/2014  FINDINGS: The cardiac silhouette is within normal limits for size. Thoracic aorta is mildly tortuous. A large mass is again seen in the left mid lung adjacent to the hilum. Abnormal parenchymal opacity extends inferiorly and laterally, partially obscuring the left heart border and corresponding to lingular consolidation on PET-CT. Interstitial markings are slightly prominent in the right lung without evidence of right lung airspace consolidation. No pleural effusion or pneumothorax is identified. No acute rib fracture is identified.  IMPRESSION: 1. Large left lung mass with persistent postobstructive pneumonia in the lingula. 2. No evidence of acute disease in the right lung. 3. No acute rib fracture identified.   Electronically Signed   By: Logan Bores   On: 04/21/2014 13:19   Ct Head Wo Contrast  03/30/2014   CLINICAL DATA:  Mental status change. Findings consistent with primary bronchogenic carcinoma on recent PET-CT.  EXAM: CT HEAD WITHOUT CONTRAST  TECHNIQUE: Contiguous axial images were obtained from the base of the skull through the vertex without intravenous contrast.  COMPARISON:  PET-CT 04/01/2014  FINDINGS: No acute intracranial abnormality is identified. Specifically, no intra or extra-axial hemorrhage, mass lesion,  mass effect, hydrocephalus, or evidence of acute cortically based infarction.  The visualized paranasal sinuses and mastoid air cells are clear the skull is intact. No suspicious osseous lesions.  IMPRESSION: No acute intracranial abnormality.   Electronically Signed   By: Curlene Dolphin M.D.   On: 04/07/2014 12:49    Medications: I have reviewed the patient's current medications.  Assessment/Plan: #1 Pneumonia: postobstructive and stable. We will request a CT-guided biopsy of the left lung mass, that likely represents bronchogenic carcinoma.  #2  Dysphagia: likely due to Parkinsons disease, and a modified barium swallow test has been requested. #3 Gait Instability: moderately severe, and will have PT evaluate his gait, likely eventual transfer to a  SNF. #4 Acute Kidney injury: improved with IVF. Will stop flomax as could be contributing to hypotension and decreased renal perfusion. Will check renal US as well.    LOS: 1 day   Leanna Battles 04/11/2014, 8:06 AM

## 2014-04-11 NOTE — Progress Notes (Signed)
Patient ID: Jose Vega, male   DOB: 1934/03/03, 80 y.o.   MRN: [id number] Request received for CT guided biopsy on pt with known left lung mass with assoc adenopathy, bilateral adrenal masses, and left RP soft tissue mass. He was originally on our schedule for OP biopsy today but was admitted 5/20 secondary to decline in overall function. Imaging studies were reviewed by Dr. Laurence Ferrari and safest, most accessible/hypermetabolic area is in the left RP region. Additional PMH as below. Exam: pt awake but lethargic; will follow commands but speaks very little; chest- distant but clear; heart- RRR; abd- soft, +BS,NT; ext- no edema.  Filed Vitals:   03/31/2014 1700 04/11/2014 1739 03/27/2014 2216 04/11/14 0628  BP: 114/71 109/73 110/75 97/67  Pulse: 85 83 106 97  Temp:  98.8 F (37.1 C) 98.2 F (36.8 C) 97.6 F (36.4 C)  TempSrc:  Oral Oral Oral  Resp: 22 18 18 20   Height:  5\' 9"  (1.753 m)    Weight:  162 lb 7.7 oz (73.7 kg)    SpO2: 93% 97% 93% 98%   Past Medical History  Diagnosis Date  . Parkinson's disease   . Hypertension   . AAA (abdominal aortic aneurysm)   . Spondylolisthesis   . Lumbar degenerative disc disease   . Lung mass   . COPD (chronic obstructive pulmonary disease)    Past Surgical History  Procedure Laterality Date  . Total knee arthroplasty      bilateral  . Hernia repair  7 years ago  . Joint replacement    . Esophagogastroduodenoscopy N/A 10/30/2013    Procedure: ESOPHAGOGASTRODUODENOSCOPY (EGD);  Surgeon: Lear Ng, MD;  Location: [facility];  Service: Endoscopy;  Laterality: N/A;   Dg Ribs Unilateral W/chest Left  04/15/2014   CLINICAL DATA:  Recent fall. Left rib pain. Mental status change. Evaluate for pneumonia.  EXAM: LEFT RIBS AND CHEST - 3+ VIEW  COMPARISON:  PET-CT 04/01/2014  FINDINGS: The cardiac silhouette is within normal limits for size. Thoracic aorta is mildly tortuous. A large mass is again seen in the left mid lung adjacent to the hilum.  Abnormal parenchymal opacity extends inferiorly and laterally, partially obscuring the left heart border and corresponding to lingular consolidation on PET-CT. Interstitial markings are slightly prominent in the right lung without evidence of right lung airspace consolidation. No pleural effusion or pneumothorax is identified. No acute rib fracture is identified.  IMPRESSION: 1. Large left lung mass with persistent postobstructive pneumonia in the lingula. 2. No evidence of acute disease in the right lung. 3. No acute rib fracture identified.   Electronically Signed   By: Logan Bores   On: 04/13/2014 13:19   Ct Head Wo Contrast  04/01/2014   CLINICAL DATA:  Mental status change. Findings consistent with primary bronchogenic carcinoma on recent PET-CT.  EXAM: CT HEAD WITHOUT CONTRAST  TECHNIQUE: Contiguous axial images were obtained from the base of the skull through the vertex without intravenous contrast.  COMPARISON:  PET-CT 04/01/2014  FINDINGS: No acute intracranial abnormality is identified. Specifically, no intra or extra-axial hemorrhage, mass lesion, mass effect, hydrocephalus, or evidence of acute cortically based infarction.  The visualized paranasal sinuses and mastoid air cells are clear the skull is intact. No suspicious osseous lesions.  IMPRESSION: No acute intracranial abnormality.   Electronically Signed   By: Curlene Dolphin M.D.   On: 04/13/2014 12:49   Ct Chest Wo Contrast  03/20/2014   CLINICAL DATA:  Followup lung mass.  EXAM: CT  CHEST WITHOUT CONTRAST  TECHNIQUE: Multidetector CT imaging of the chest was performed following the standard protocol without IV contrast.  COMPARISON:  02/27/2014  FINDINGS: Normal heart size. No pericardial effusion identified. Calcified atherosclerotic disease and walls the thoracic aorta as well as the LAD, left circumflex and RCA coronary arteries. Pre-vascular lymph node measures 9 mm, image 28/series 2. Previously this measured the same. AP window lymph  node is stable measuring 9 mm. No enlarged right paratracheal, right hilar or sub- carinal lymph nodes. No supraclavicular or axillary adenopathy identified.  No pleural effusion identified. Left upper lobe perihilar lung mass is again noted. This measures 9.8 cm, image 36/series 4 and is unchanged in size from previous exam. Although this does not appear to be narrowing at the left upper lobe bronchus, there is some debris within the airway.  Multiple small stress set multiple smaller peripheral nodules are noted in the left upper lobe. Some of these are new from previous exam. For example, peripheral subpleural nodule in left upper lobe measures 8 mm, image 26/series 3. Some of these nodules within the left upper lobe may have a tree-in-bud configuration. There is a granuloma identified in the right lower lobe which appears unchanged from previous exam.  Incidental imaging through the upper abdomen shows large bilateral adrenal masses. Left adrenal mass measures 9.6 cm, image 58/series 2. Previously 5.4 cm. The left adrenal mass measured 8.4 cm, image 58/series 2. Previously 6.2 cm.  Review of the visualized osseous structures is negative for aggressive lytic or sclerotic bone lesion.  IMPRESSION: 1. Persistent left upper lobe mass following course of antibiotic therapy. Given the history of smoking primary bronchogenic carcinoma should be excluded. This should be easily amendable to tissue sampling with bronchoscopy. 2. Borderline enlarged left-sided mediastinal lymph nodes. No evidence for adenopathy within the contralateral mediastinum or hilar region. 3. Interval enlargement of bilateral adrenal masses. In the setting of lung cancer these would be concerning for metastatic disease. 4. Atherosclerotic disease including multi vessel coronary artery calcifications.   Electronically Signed   By: Kerby Moors M.D.   On: 03/20/2014 12:04   Nm Pet Image Initial (pi) Skull Base To Thigh  04/01/2014   CLINICAL  DATA:  Initial treatment strategy for left lung mass.  EXAM: NUCLEAR MEDICINE PET SKULL BASE TO THIGH  TECHNIQUE: 10.1 mCi F-18 FDG was injected intravenously. Full-ring PET imaging was performed from the skull base to thigh after the radiotracer. CT data was obtained and used for attenuation correction and anatomic localization.  FASTING BLOOD GLUCOSE:  Value: 91 mg/dl  COMPARISON:  Chest CT on 03/20/2014 and abdomen pelvis CTA on 09/25/2013  FINDINGS: NECK  A hypermetabolic focus is seen in the left nasopharynx at the fossa of Rosenmuller, with maximum SUV of 4.9. No corresponding soft tissue mass is seen on CT images. No other hypermetabolic masses or lymph nodes are identified within the neck.  CHEST  Large multi lobulated hypermetabolic mass is seen in the left upper lobe and lingula measuring 8 cm, with involvement of the left hilum. This has a maximum SUV of 37.8, consistent with primary bronchogenic carcinoma. Mild hypermetabolic infiltrate is seen in the peripheral lingula, consistent with postobstructive pneumonitis.  Hypermetabolic lymphadenopathy is seen in superior left hilum and in the mediastinum in the AP window and lateral aortic region. Index lymph node in the aortopulmonary window measures 1.5 cm, and has a SUV max of 4.5. No contralateral hypermetabolic hilar or mediastinal lymphadenopathy identified.  ABDOMEN/PELVIS  No abnormal  hypermetabolic activity within the liver, pancreas, or spleen. Large bilateral adrenal masses show intense nodular hypermetabolic activity peripherally, consistent with large bilateral adrenal metastases. Maximum SUV from the right adrenal mass measures 20.8, and maximum SUV from the left adrenal mass measures 17.0.  No hypermetabolic lymph nodes in the abdomen. A 2.3 cm hypermetabolic left pelvic retroperitoneal masses seen adjacent to the left iliacus muscle, which has a maximum SUV of 19.3, consistent with metastatic disease.  Incidental note is made of a infrarenal  abdominal aortic aneurysm measuring 5.1 cm in maximum diameter. No evidence of aneurysm leak or rupture.  SKELETON  Diffusely increased hypermetabolic activity is seen throughout the bone marrow, however no focal areas of hypermetabolic skeletal activity are seen to suggest the presence of bone metastases.  IMPRESSION: 8 cm hypermetabolic mass in the left upper lobe with involvement of the left hilum, consistent with primary bronchogenic carcinoma. Hypermetabolic postobstructive pneumonitis also seen in the peripheral lingula.  Mild metastatic lymphadenopathy in the left hilum and ipsilateral mediastinum.  Large bilateral adrenal metastases.  Hypermetabolic mass or lymphadenopathy in the left pelvic retroperitoneum adjacent to the left iliacus muscle.  Hypermetabolic focus in the left nasopharynx fossa of Rosenmuller, without visible mass or lymphadenopathy by CT. Suggest nasopharyngoscopy for direct visualization.  5.1 cm infrarenal abdominal aortic aneurysm incidentally noted. Recommend followup by abdomen pelvis CTA in 3 6 months, and consider vascular surgery referral/consultation if not already obtained. This recommendation follows ACR consensus guidelines: White paper of the ACR incidental findings committee 2 on vascular findings. J Am Col Radiol 2013: 74:128-786.   Electronically Signed   By: Earle Gell M.D.   On: 04/01/2014 16:07  Results for orders placed during the hospital encounter of 04/13/2014  CULTURE, BLOOD (ROUTINE X 2)      Result Value Ref Range   Specimen Description BLOOD RIGHT ANTECUBITAL     Special Requests BOTTLES DRAWN AEROBIC AND ANAEROBIC 5CC     Culture  Setup Time       Value: 04/21/2014 17:07     Performed at [facility]   Culture       Value:        BLOOD CULTURE RECEIVED NO GROWTH TO DATE CULTURE WILL BE HELD FOR 5 DAYS BEFORE ISSUING A FINAL NEGATIVE REPORT     Performed at [facility]   Report Status PENDING    CULTURE, BLOOD (ROUTINE X 2)       Result Value Ref Range   Specimen Description BLOOD LEFT ANTECUBITAL     Special Requests BOTTLES DRAWN AEROBIC AND ANAEROBIC 4ML     Culture  Setup Time       Value: 03/24/2014 17:07     Performed at [facility]   Culture       Value:        BLOOD CULTURE RECEIVED NO GROWTH TO DATE CULTURE WILL BE HELD FOR 5 DAYS BEFORE ISSUING A FINAL NEGATIVE REPORT     Performed at [facility]   Report Status PENDING    CBC      Result Value Ref Range   WBC 16.5 (*) 4.0 - 10.5 K/uL   RBC 3.97 (*) 4.22 - 5.81 MIL/uL   Hemoglobin 10.1 (*) 13.0 - 17.0 g/dL   HCT 32.1 (*) 39.0 - 52.0 %   MCV 80.9  78.0 - 100.0 fL   MCH 25.4 (*) 26.0 - 34.0 pg   MCHC 31.5  30.0 - 36.0 g/dL   RDW  16.5 (*) 11.5 - 15.5 %   Platelets 431 (*) 150 - 400 K/uL  COMPREHENSIVE METABOLIC PANEL      Result Value Ref Range   Sodium 138  137 - 147 mEq/L   Potassium 5.1  3.7 - 5.3 mEq/L   Chloride 99  96 - 112 mEq/L   CO2 20  19 - 32 mEq/L   Glucose, Bld 82  70 - 99 mg/dL   BUN 40 (*) 6 - 23 mg/dL   Creatinine, Ser 2.09 (*) 0.50 - 1.35 mg/dL   Calcium 11.1 (*) 8.4 - 10.5 mg/dL   Total Protein 8.6 (*) 6.0 - 8.3 g/dL   Albumin 2.7 (*) 3.5 - 5.2 g/dL   AST 17  0 - 37 U/L   ALT 7  0 - 53 U/L   Alkaline Phosphatase 99  39 - 117 U/L   Total Bilirubin 0.5  0.3 - 1.2 mg/dL   GFR calc non Af Amer 29 (*) >90 mL/min   GFR calc Af Amer 33 (*) >90 mL/min  PROTIME-INR      Result Value Ref Range   Prothrombin Time 15.2  11.6 - 15.2 seconds   INR 1.23  0.00 - 1.49  URINALYSIS, ROUTINE W REFLEX MICROSCOPIC      Result Value Ref Range   Color, Urine AMBER (*) YELLOW   APPearance CLOUDY (*) CLEAR   Specific Gravity, Urine 1.024  1.005 - 1.030   pH 5.0  5.0 - 8.0   Glucose, UA NEGATIVE  NEGATIVE mg/dL   Hgb urine dipstick NEGATIVE  NEGATIVE   Bilirubin Urine LARGE (*) NEGATIVE   Ketones, ur 15 (*) NEGATIVE mg/dL   Protein, ur 30 (*) NEGATIVE mg/dL   Urobilinogen, UA 1.0  0.0 - 1.0 mg/dL   Nitrite NEGATIVE   NEGATIVE   Leukocytes, UA NEGATIVE  NEGATIVE  TROPONIN I      Result Value Ref Range   Troponin I <0.30  <0.30 ng/mL  URINE MICROSCOPIC-ADD ON      Result Value Ref Range   Squamous Epithelial / LPF FEW (*) RARE   RBC / HPF 3-6  <3 RBC/hpf   Bacteria, UA MANY (*) RARE   Casts HYALINE CASTS (*) NEGATIVE   Urine-Other MUCOUS PRESENT    APTT      Result Value Ref Range   aPTT 48 (*) 24 - 37 seconds  PROTIME-INR      Result Value Ref Range   Prothrombin Time 16.3 (*) 11.6 - 15.2 seconds   INR 1.34  0.00 - 1.49  COMPREHENSIVE METABOLIC PANEL      Result Value Ref Range   Sodium 140  137 - 147 mEq/L   Potassium 4.8  3.7 - 5.3 mEq/L   Chloride 101  96 - 112 mEq/L   CO2 21  19 - 32 mEq/L   Glucose, Bld 75  70 - 99 mg/dL   BUN 38 (*) 6 - 23 mg/dL   Creatinine, Ser 1.96 (*) 0.50 - 1.35 mg/dL   Calcium 10.8 (*) 8.4 - 10.5 mg/dL   Total Protein 8.0  6.0 - 8.3 g/dL   Albumin 2.4 (*) 3.5 - 5.2 g/dL   AST 15  0 - 37 U/L   ALT <5  0 - 53 U/L   Alkaline Phosphatase 92  39 - 117 U/L   Total Bilirubin 0.6  0.3 - 1.2 mg/dL   GFR calc non Af Amer 31 (*) >90 mL/min   GFR calc Af Wyvonnia Lora  36 (*) >90 mL/min  CBC      Result Value Ref Range   WBC 13.3 (*) 4.0 - 10.5 K/uL   RBC 3.68 (*) 4.22 - 5.81 MIL/uL   Hemoglobin 9.3 (*) 13.0 - 17.0 g/dL   HCT 30.2 (*) 39.0 - 52.0 %   MCV 82.1  78.0 - 100.0 fL   MCH 25.3 (*) 26.0 - 34.0 pg   MCHC 30.8  30.0 - 36.0 g/dL   RDW 16.8 (*) 11.5 - 15.5 %   Platelets 352  150 - 400 K/uL  CBG MONITORING, ED      Result Value Ref Range   Glucose-Capillary 79  70 - 99 mg/dL   Comment 1 Notify RN     A/P:Pt with known left lung mass with assoc adenopathy/post obst PNA, bilateral adrenal masses, and left RP soft tissue mass.Plan is for CT guided biopsy of the left RP soft tissue mass today. Details/risks of procedure d/w pt /wife with their understanding and consent.

## 2014-04-11 NOTE — Progress Notes (Signed)
Clinical Social Work Department BRIEF PSYCHOSOCIAL ASSESSMENT 04/11/2014  Patient:  Jose Vega, Jose Vega     Account Number:  [id number]     Admit date:  03/28/2014  Clinical Social Worker:  Earlie Server  Date/Time:  04/11/2014 05:00 PM  Referred by:  Physician  Date Referred:  04/11/2014 Referred for  SNF Placement   Other Referral:   Interview type:  Family Other interview type:    PSYCHOSOCIAL DATA Living Status:  FAMILY Admitted from facility:   Level of care:   Primary support name:  Lynda Primary support relationship to patient:  SPOUSE Degree of support available:   Adequate    CURRENT CONCERNS Current Concerns  Post-Acute Placement   Other Concerns:    SOCIAL WORK ASSESSMENT / PLAN CSW received referral in order to assist with DC planning. CSW met with patient, wife, step-dtr and her husband, and patient's son. CSW introduced myself and explained role.    Family interested in SNF at this time and reports that patient has been to [facility] in the past and would like to return. CSW provided SNF list and explained Medicare coverage for SNF and possible copays. Family inquired about LT placement so applying for Medicaid was discussed and family reports they have an Estate agent on board already. CSW provided information for the Department of Social Services as well.    Son spoke with CSW in private re: HCPOA and possible hospice. CSW explained that CSW would need to talk with MD to determine if patient is eligible for hospice and explained if patient is not completely alert and oriented then CSW could not assist with HCPOA at this time.    CSW completed FL2 and faxed out. All family in agreement and wants everyone involved in decision making. CSW will continue to follow.   Assessment/plan status:  Psychosocial Support/Ongoing Assessment of Needs Other assessment/ plan:   Information/referral to community resources:   SNF list  Medicaid information     PATIENT'S/FAMILY'S RESPONSE TO PLAN OF CARE: Patient unable to fully participate in assessment at this time. Wife has flat affect and allows children to ask majority of questions. All family interested in placement and concerned about patient's prognosis. Family all engaged and asked appropriate questions. Family appreciative of CSW visit and has CSW contact information if needed.       [place], [address] [phone]

## 2014-04-11 NOTE — Progress Notes (Signed)
Clinical Social Work  CSW received referral in order to assist with DC planning. CSW went to room but patient gone for a procedure. RN reports patient and family went for biopsy. CSW left SNF list and CSW contact information in room and RN will alert family to call CSW with any questions. CSW will follow up once patient back in room to complete full assessment.  [place], [place] [phone]

## 2014-04-11 NOTE — Evaluation (Signed)
SLP Cancellation Note  Patient Details Name: Jose Vega MRN: [id number] DOB: [birth year]   Cancelled treatment:       Reason Eval/Treat Not Completed:  (pt has not yet had guided biopsy at this time, therefore will have to defer MBS, SLP to follow up next date for MBS test as ordered )  Thanks.    [place], [place] [facility] SLP [phone]

## 2014-04-11 NOTE — Progress Notes (Signed)
INITIAL NUTRITION ASSESSMENT  Pt meets criteria for severe MALNUTRITION in the context of chronic illness as evidenced by 15% weight loss in the past 6 months with severe muscle wasting and subcutaneous fat loss in temporal and orbital region, clavicles, upper arms, and hands.  DOCUMENTATION CODES Per approved criteria  -Severe malnutrition in the context of chronic illness   INTERVENTION: - Diet advancement per SLP - RD to continue to monitor   NUTRITION DIAGNOSIS: Inadequate oral intake related to inability to eat as evidenced by NPO.   Goal: Advance diet as tolerated to diet per SLP  Monitor:  Weights, labs, diet advancement  Reason for Assessment: Malnutrition screening   80 y.o. male  Admitting Dx: CAP (community acquired pneumonia)  ASSESSMENT: Pt was brought to the emergency room yesterday because of progressive decline in overall function over the past few days. He has been eating and drinking very little in the past 2 weeks and has been losing weight. He states that he just is not hungry, and that he does not have problems swallowing. He has also had more difficult to comprehend speech over the past few days and extreme difficulty ambulating. Over the past one to 2 months he has been diagnosed with a left upper lobe lung mass with recent postobstructive pneumonia. 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   -Pt discussed during multidisciplinary rounds.  -Pt alone in room, speaks softly and is hard to understand -States he was eating 3-4 meals/day and was drinking 3-4 Ensure/day, however per MD notes, pt with minimal intake in the past 2 weeks -Reports he weighed 229 pounds 6 months ago, now weighs 162 pounds -Past weight trend shows pt's weight was 190 pounds in December 2014  BUN/Cr elevated with low GFR  Nutrition Focused Physical Exam:  Subcutaneous Fat:  Orbital Region:  severe muscle wasting and subcutaneous fat loss Upper Arm Region: severe muscle wasting and subcutaneous fat loss Thoracic and Lumbar Region: severe muscle wasting and subcutaneous fat loss  Muscle:  Temple Region: severe muscle wasting and subcutaneous fat loss Clavicle Bone Region: severe muscle wasting and subcutaneous fat loss Clavicle and Acromion Bone Region: severe muscle wasting and subcutaneous fat loss Scapular Bone Region: NA Dorsal Hand: severe muscle wasting and subcutaneous fat loss  Patellar Region: mild fat loss and muscle wasting Anterior Thigh Region: mild fat loss and muscle wasting Posterior Calf Region: mild fat loss and muscle wasting  Edema: None noted    Height: Ht Readings from Last 1 Encounters:  04/09/2014 5\' 9"  (1.753 m)    Weight: Wt Readings from Last 1 Encounters:  04/07/2014 162 lb 7.7 oz (73.7 kg)    Ideal Body Weight: 160 lbs   % Ideal Body Weight: 101%  Wt Readings from Last 10 Encounters:  04/20/2014 162 lb 7.7 oz (73.7 kg)  03/29/14 174 lb (78.926 kg)  03/22/14 184 lb (83.462 kg)  10/29/13 190 lb 3.2 oz (86.274 kg)  10/29/13 190 lb 3.2 oz (86.274 kg)  09/25/13 205 lb (92.987 kg)  09/11/13 209 lb (94.802 kg)  08/07/13 209 lb 6.4 oz (94.983 kg)  07/10/13 203 lb (92.08 kg)  05/23/13 212 lb (96.163 kg)    Usual Body Weight: 229 lbs 6 months ago per pt  % Usual Body Weight: 71%  BMI:  Body mass index is 23.98 kg/(m^2).  Estimated Nutritional Needs: Kcal: 1850-2050 Protein: 90-110g Fluid: 1.8-2L/day  Skin: Intact  Diet Order: NPO  EDUCATION NEEDS: -No education needs identified at this time   Intake/Output Summary (Last 24 hours) at 04/11/14 1229 Last data filed at 04/11/14 0900  Gross per 24 hour  Intake      0 ml  Output    245 ml  Net   -245 ml    Last BM: 5/21  Labs:   Recent Labs Lab 04/05/2014 1138 04/11/14 0521  NA 138 140  K 5.1 4.8  CL 99 101  CO2 20 21  BUN 40* 38*  CREATININE 2.09* 1.96*  CALCIUM  11.1* 10.8*  GLUCOSE 82 75    CBG (last 3)   Recent Labs  04/06/2014 1146  GLUCAP 79    Scheduled Meds: . Carbidopa-Levodopa ER  2 tablet Oral TID  . enoxaparin (LOVENOX) injection  30 mg Subcutaneous Q24H  . piperacillin-tazobactam (ZOSYN)  IV  3.375 g Intravenous 3 times per day  . sodium chloride  3 mL Intravenous Q12H    Continuous Infusions:   Past Medical History  Diagnosis Date  . Parkinson's disease   . Hypertension   . AAA (abdominal aortic aneurysm)   . Spondylolisthesis   . Lumbar degenerative disc disease   . Lung mass   . COPD (chronic obstructive pulmonary disease)     Past Surgical History  Procedure Laterality Date  . Total knee arthroplasty      bilateral  . Hernia repair  7 years ago  . Joint replacement    . Esophagogastroduodenoscopy N/A 10/30/2013    Procedure: ESOPHAGOGASTRODUODENOSCOPY (EGD);  Surgeon: Lear Ng, MD;  Location: [facility];  Service: Endoscopy;  Laterality: N/A;    Mikey College MS, [place], [place] Pager [phone] After Hours Pager

## 2014-04-11 NOTE — Evaluation (Signed)
Occupational Therapy Evaluation Patient Details Name: Jose Vega MRN: [id number] DOB: Jul 03, 1934 Today's Date: 04/11/2014    History of Present Illness Pt was admitted with progressive decline/weakness. he has decreased PO intake and weight loss.  In the spring, pt fell and sustained R humerus and L rib fxs.  PMH significant for Parkinson's, lung mass and COPD   Clinical Impression   This 80 year old man with h/o Parkinson's was admitted with progressive decline/weakness.  Pt has h/o R humerus fx from October.  He is scheduled for CT guided biopsy later today.  Pt has needed assistance with adls and has been performing SPTs from w/c.Pt requires min A for sit to stand; +2 assistance for lines and safety was given with ambulation.   Pt will benefit from skilled OT to increase safety and endurance for standing during adls and toilet transfers.  Goals in acute are for overall min guard level.      Follow Up Recommendations  SNF    Equipment Recommendations  None recommended by OT    Recommendations for Other Services       Precautions / Restrictions Precautions Precautions: Fall Restrictions Weight Bearing Restrictions: No      Mobility Bed Mobility                  Transfers Overall transfer level: Needs assistance Equipment used: Rolling walker (2 wheeled) Transfers: Sit to/from Stand Sit to Stand: Min assist;+2 safety/equipment         General transfer comment: Decreased control of descent after walking to sink.  Pt was able to control descent with first sit to stand    Balance                                            ADL Overall ADL's : Needs assistance/impaired Eating/Feeding: NPO   Grooming: Minimal assistance;Sitting (lotion)   Upper Body Bathing: Minimal assitance;Sitting   Lower Body Bathing: Maximal assistance;Sit to/from stand   Upper Body Dressing : Minimal assistance;Sitting   Lower Body Dressing: Total assistance;Sit  to/from stand   Toilet Transfer: Minimal assistance;Stand-pivot;+2 for safety/equipment;BSC           Functional mobility during ADLs: +2 for safety/equipment;Minimal assistance;Rolling walker General ADL Comments: Pt fatiques easily.  Ambulated about 3 feet to sink then needed to sit.       Vision                     Perception     Praxis      Pertinent Vitals/Pain C/o R arm pain:  RN brought pain meds     Hand Dominance     Extremity/Trunk Assessment Upper Extremity Assessment Upper Extremity Assessment: Generalized weakness;RUE deficits/detail RUE Deficits / Details: h/o fx in R upper arm.  Pt able to lift approximately 30 degrees; distally wfls, but decreased strength           Communication Communication Communication:  (very soft voice; talks fast; difficult to understand )   Cognition Arousal/Alertness: Awake/alert Behavior During Therapy: WFL for tasks assessed/performed Overall Cognitive Status: Within Functional Limits for tasks assessed (mostly; some decreased memory for dates)                     General Comments       Exercises       Shoulder Instructions  Home Living Family/patient expects to be discharged to:: Unsure Living Arrangements: Spouse/significant other                                      Prior Functioning/Environment Level of Independence: Needs assistance        Comments: has been using w/c per chart and doing SPTs; assistance with adls per pt    OT Diagnosis: Generalized weakness   OT Problem List: Decreased strength;Decreased activity tolerance;Decreased knowledge of use of DME or AE;Impaired balance (sitting and/or standing);Pain   OT Treatment/Interventions: Self-care/ADL training;DME and/or AE instruction;Balance training;Patient/family education;Therapeutic exercise;Energy conservation    OT Goals(Current goals can be found in the care plan section) Acute Rehab OT Goals OT Goal  Formulation: With patient Time For Goal Achievement: 04/25/14 Potential to Achieve Goals: Good ADL Goals Pt Will Perform Grooming: standing;with min guard assist Pt Will Transfer to Toilet: with min guard assist;bedside commode;ambulating Additional ADL Goal #1: Pt will go from sit to stand with min guard and maintain this for 3 minutes for adls. Additional ADL Goal #2: Pt will perform shoulder table top exercises with supervision to increase ROM/strength for adls.  OT Frequency: Min 2X/week   Barriers to D/C:            Co-evaluation              End of Session    Activity Tolerance: Patient limited by fatigue Patient left: in chair;with call bell/phone within reach;with family/visitor present   Time: 1142-1202 OT Time Calculation (min): 20 min Charges:  OT General Charges $OT Visit: 1 Procedure OT Evaluation $Initial OT Evaluation Tier I: 1 Procedure OT Treatments $Self Care/Home Management : 8-22 mins G-Codes:    Lesle Chris 04/07/2014, 1:20 PM Lesle Chris, OTR/L [phone] 04/02/2014

## 2014-04-12 ENCOUNTER — Inpatient Hospital Stay (HOSPITAL_COMMUNITY): Payer: Medicare Other

## 2014-04-12 ENCOUNTER — Other Ambulatory Visit (HOSPITAL_COMMUNITY): Payer: Medicare Other

## 2014-04-12 LAB — URINE CULTURE
COLONY COUNT: NO GROWTH
Culture: NO GROWTH

## 2014-04-12 MED ORDER — POTASSIUM CHLORIDE IN NACL 20-0.9 MEQ/L-% IV SOLN
INTRAVENOUS | Status: DC
  Administered 2014-04-12: 100 mL/h via INTRAVENOUS
  Filled 2014-04-12 (×4): qty 1000

## 2014-04-12 NOTE — Progress Notes (Signed)
Clinical Social Work  Son had expressed interest in patient having HCPOA assigned for patient. CSW explained process and agreeable to assess patient to ensure he was alert and oriented. CSW met with patient at bedside who was able to state the year, the president, his birthday, and his location. Patient speaks softly and mumbles at times. CSW explained HCPOA and patient reports he is torn about naming his wife or son and would prefer both to be able to make decisions. CSW explained that patient could consider options and if he was interested in completing forms at later time to let CSW know. CSW continues to work on placement. [facility] agreeable to accept if patient and/or family can settle previous bill. CSW will continue to talk with SNF to verify plans and will communicate needs with family.  [place], [place] [phone]

## 2014-04-12 NOTE — Progress Notes (Signed)
Call received from speech therapy.  Pt did not pass any of the swallow eavl. Speech therapy to speak with the MD.  Keeping pt NPO until further orders received. Moreen Fowler, RN

## 2014-04-12 NOTE — Progress Notes (Signed)
Subjective: He remains weak with a soft voice, no dyspnea on room air.  Objective: Vital signs in last 24 hours: Temp:  [97.4 F (36.3 C)-97.8 F (36.6 C)] 97.4 F (36.3 C) (05/22 0550) Pulse Rate:  [66-88] 66 (05/22 0550) Resp:  [16-18] 18 (05/22 0550) BP: (88-111)/(58-74) 105/72 mmHg (05/22 0550) SpO2:  [96 %-98 %] 98 % (05/21 2206) Weight change:    Intake/Output from previous day: 05/21 0701 - 05/22 0700 In: 200 [IV Piggyback:200] Out: 1000 [Urine:1000]   General appearance: alert, cooperative and no distress Resp: clear to auscultation bilaterally Cardio: regular rate and rhythm GI: soft, non-tender; bowel sounds normal; no masses,  no organomegaly Extremities: 4 limp atrophy, no edema  Lab Results:  Recent Labs  04/09/2014 1138 04/11/14 0521  WBC 16.5* 13.3*  HGB 10.1* 9.3*  HCT 32.1* 30.2*  PLT 431* 352   BMET  Recent Labs  03/24/2014 1138 04/11/14 0521  NA 138 140  K 5.1 4.8  CL 99 101  CO2 20 21  GLUCOSE 82 75  BUN 40* 38*  CREATININE 2.09* 1.96*  CALCIUM 11.1* 10.8*   CMET CMP     Component Value Date/Time   NA 140 04/11/2014 0521   K 4.8 04/11/2014 0521   CL 101 04/11/2014 0521   CO2 21 04/11/2014 0521   GLUCOSE 75 04/11/2014 0521   BUN 38* 04/11/2014 0521   CREATININE 1.96* 04/11/2014 0521   CREATININE 1.26 09/24/2013 1322   CALCIUM 10.8* 04/11/2014 0521   PROT 8.0 04/11/2014 0521   ALBUMIN 2.4* 04/11/2014 0521   AST 15 04/11/2014 0521   ALT <5 04/11/2014 0521   ALKPHOS 92 04/11/2014 0521   BILITOT 0.6 04/11/2014 0521   GFRNONAA 31* 04/11/2014 0521   GFRAA 36* 04/11/2014 0521    CBG (last 3)   Recent Labs  03/26/2014 1146  GLUCAP 79    INR RESULTS:   Lab Results  Component Value Date   INR 1.34 04/19/2014   INR 1.23 04/07/2014     Studies/Results: Dg Ribs Unilateral W/chest Left  04/19/2014   CLINICAL DATA:  Recent fall. Left rib pain. Mental status change. Evaluate for pneumonia.  EXAM: LEFT RIBS AND CHEST - 3+ VIEW  COMPARISON:   PET-CT 04/01/2014  FINDINGS: The cardiac silhouette is within normal limits for size. Thoracic aorta is mildly tortuous. A large mass is again seen in the left mid lung adjacent to the hilum. Abnormal parenchymal opacity extends inferiorly and laterally, partially obscuring the left heart border and corresponding to lingular consolidation on PET-CT. Interstitial markings are slightly prominent in the right lung without evidence of right lung airspace consolidation. No pleural effusion or pneumothorax is identified. No acute rib fracture is identified.  IMPRESSION: 1. Large left lung mass with persistent postobstructive pneumonia in the lingula. 2. No evidence of acute disease in the right lung. 3. No acute rib fracture identified.   Electronically Signed   By: Logan Bores   On: 04/18/2014 13:19   Ct Head Wo Contrast  04/11/2014   CLINICAL DATA:  Mental status change. Findings consistent with primary bronchogenic carcinoma on recent PET-CT.  EXAM: CT HEAD WITHOUT CONTRAST  TECHNIQUE: Contiguous axial images were obtained from the base of the skull through the vertex without intravenous contrast.  COMPARISON:  PET-CT 04/01/2014  FINDINGS: No acute intracranial abnormality is identified. Specifically, no intra or extra-axial hemorrhage, mass lesion, mass effect, hydrocephalus, or evidence of acute cortically based infarction.  The visualized paranasal sinuses and mastoid air  cells are clear the skull is intact. No suspicious osseous lesions.  IMPRESSION: No acute intracranial abnormality.   Electronically Signed   By: Curlene Dolphin M.D.   On: 04/01/2014 12:49   US Renal  04/11/2014   CLINICAL DATA:  Acute renal insufficiency, lung cancer  EXAM: RENAL/URINARY TRACT ULTRASOUND COMPLETE  COMPARISON:  PET-CT 04/01/2014, CTA abdomen 09/25/2013  FINDINGS: Right Kidney:  Length: 10.3 cm. Normal cortical thickness RIGHT kidney. Slightly increased cortical echogenicity. Tiny complicated appearing cyst at at upper pole 8  x 8 x 6 mm. No definite hydronephrosis or shadowing calcifications. No additional intra renal masses.  Left Kidney:  Length: 11.1 cm. Normal cortical thickness. Increased cortical echogenicity. No intra renal mass, hydronephrosis, or shadowing calcification.  Bladder:  Appears normal for degree of bladder distention.  Large complex RIGHT suprarenal mass 11.9 x 10.6 x 9.5 cm containing solid and cystic components and corresponding to a hypermetabolic RIGHT adrenal mass seen on prior PET-CT.  Additionally, large complex LEFT suprarenal mass 9.5 x 10.2 x 8.3 cm containing solid and cystic components, corresponding to a hypermetabolic LEFT adrenal mass identified on prior CT.  Sludge within gallbladder.  IMPRESSION: Medical renal disease changes of both kidneys without hydronephrosis.  Large complex BILATERAL adrenal masses as above question adrenal metastases in patient with pulmonary malignancy   Electronically Signed   By: Lavonia Dana M.D.   On: 04/11/2014 17:16   Ct Biopsy  04/11/2014   CLINICAL DATA:  80 year old male with metastatic malignancy of uncertain primary. Based on imaging, the primary lesion is likely a dominant left upper lobe pulmonary mass. Percutaneous biopsy of a hypermetabolic soft tissue nodule in the left lower quadrant retroperitoneal spaces indicated to confirm tissue diagnosis.  EXAM: CT GUIDED NEEDLE CORE BIOPSY OF LEFT LOWER QUADRANT RETROPERITONEAL NODULE  Date: 04/11/2018  ANESTHESIA/SEDATION: None required  PROCEDURE: The procedure risks, benefits, and alternatives were explained to the patient. Questions regarding the procedure were encouraged and answered. The patient understands and consents to the procedure.  The operative field was prepped with Betadine/chlorhexidinein a sterile fashion, and a sterile drape was applied covering the operative field. Local anesthesia was provided with 1% Lidocaine.  Planning axial CT scan was performed a suitable skin entry site selected and marked.  A small dermatotomy was made at the anesthetized skin site. Under intermittent CT fluoroscopic guidance, an 17 gauge trocar needle was advanced to the margin of the soft tissue nodule. Multiple 18 gauge core biopsies were then coaxially obtained using the BioPince automated biopsy device.  The biopsy device was removed.  There was no immediate complication.  Complications: None  FINDINGS: Successful core biopsy of left lower quadrant retroperitoneal soft tissue nodule. Of note, the biopsy fragments were very soft and a gelatinous.  IMPRESSION: Technically successful CT-guided core biopsy of hypermetabolic soft tissue nodule in the left lower quadrant retroperitoneal space. This nodule was selected as it had the least amount of central necrosis on the recently obtained PET-CT an should have the most likely at of providing a diagnostic sample.  Final pathology is pending.  Signed,  Criselda Peaches, MD  [facility]  [facility]   Electronically Signed   By: Jacqulynn Cadet M.D.   On: 04/11/2014 17:08    Medications: I have reviewed the patient's current medications.  Assessment/Plan: #1 Pneumonia: stable on current meds. #2 Dysphagia: NPO for now pending MBS study to be done today, then hopefully can restart feeding #3 Lung Mass: likely bronchogenic  CA and biopsy of retroperitoneal mass done yesterday with path results pending  #4 Failure to Thrive: very weak with significant weak loss due to Parkinsons versus malignancy associated cachexia. Will plan on transfer to a SNF early next week.   LOS: 2 days   Leanna Battles 04/12/2014, 7:36 AM

## 2014-04-12 NOTE — Progress Notes (Signed)
Clinical Social Work  CSW spoke with [employer] who reports they need to speak with family re: previous bill. CSW met with patient and wife at bedside. Patient reports he was aware of previous bill. Wife reports she will talk with [facility]. Patient and wife agreeable to expand search to [facility] in case they are unable to settle with [facility]. CSW expanded search and will ask weekend CSW to follow up with bed offers. CSW called and left a message with son in order to share information re: SNF and HCPOA information.  [place], [place] [phone]

## 2014-04-12 NOTE — Procedures (Addendum)
Objective Swallowing Evaluation: Modified Barium Swallowing Study  Patient Details  Name: ADELBERT GASPARD MRN: [id number] Date of Birth: 02-May-1934  Today's Date: 04/12/2014 Time: 1230-1307 SLP Time Calculation (min): 37 min  Past Medical History:  Past Medical History  Diagnosis Date  . Parkinson's disease   . Hypertension   . AAA (abdominal aortic aneurysm)   . Spondylolisthesis   . Lumbar degenerative disc disease   . Lung mass   . COPD (chronic obstructive pulmonary disease)    Past Surgical History:  Past Surgical History  Procedure Laterality Date  . Total knee arthroplasty      bilateral  . Hernia repair  7 years ago  . Joint replacement    . Esophagogastroduodenoscopy N/A 10/30/2013    Procedure: ESOPHAGOGASTRODUODENOSCOPY (EGD);  Surgeon: Lear Ng, MD;  Location: [facility];  Service: Endoscopy;  Laterality: N/A;   HPI:  Pt was brought to the because of progressive decline in overall function over the past few days. He has been eating and drinking very little in the past 2 weeks and has been losing weight per MD note.  Pt stated to MD that he just is not hungry, and that he does not have problems swallowing. He has also had more difficult to comprehend speech over the past few days and extreme difficulty ambulating. Over the past one to 2 months he has been diagnosed with a left upper lobe lung mass with recent postobstructive pneumonia per MD note. 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 MD ordered MBS due to concern for dysphagia.       Assessment / Plan / Recommendation Clinical Impression  Dysphagia Diagnosis: Severe oral phase dysphagia;Moderate oral phase dysphagia;Severe pharyngeal phase dysphagia Clinical impression: Moderately severe oropharyngeal dysphagia characterized by significant sensorimotor deficits resulting in severe weakness with delayed oral  transiting, oropharyngeal stasis and chronic penetration/aspiration.  Pt is severely dysarthric and nearly incomprehensible.  Unfortunately, pt's reflexive and volitional cough/throat clear and dry swallow are weak and nonprotective.   Following solids with liquids faciliated pharyngeal clearance but with aspiration of liquids.  Various strategies attempted to miniimize aspiration including head turn left and chin tuck without significant benefit.  Using video monitor and verbal feedback, SLP educated pt to findings and compenstion strategies.  SLP questions if pt's dysphagia has contributed to his weight loss and pulmonary issue *? pna + lung carcinoma per chart review.  Rec MD speak to pt re: his level of dysphagia and options as he will aspirate with any consistency based on MBS findings.  Anticipate poor tolerance of aspiration due to pt's deconditioning, medical dx (? carcinoma) and progressive neurological dx.       Treatment Recommendation  Therapy as outlined in treatment plan below    Diet Recommendation NPO;Ice chips PRN after oral care (defer to md for further plans) ? Repeat mbs with clinical improvement?        Other  Recommendations Oral Care Recommendations: Oral care Q4 per protocol   Follow Up Recommendations    tbd, ? snf   Frequency and Duration min 2x/week  1 week   Pertinent Vitals/Pain Afebrile, decreased    SLP Swallow Goals     General Date of Onset: 04/12/14 HPI: Pt was brought to the because of progressive decline in overall function over the past few days. He has been eating and drinking very little in the past 2 weeks and has  been losing weight per MD note.  Pt stated to MD that he just is not hungry, and that he does not have problems swallowing. He has also had more difficult to comprehend speech over the past few days and extreme difficulty ambulating. Over the past one to 2 months he has been diagnosed with a left upper lobe lung mass with recent  postobstructive pneumonia per MD note. 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 MD ordered MBS due to concern for dysphagia.   Type of Study: Modified Barium Swallowing Study Reason for Referral: Objectively evaluate swallowing function Diet Prior to this Study: Regular;Thin liquids Temperature Spikes Noted: No Respiratory Status: Room air History of Recent Intubation: No Behavior/Cognition: Alert Oral Cavity - Dentition: Missing dentition;Poor condition (lower dentition - no upper) Oral Motor / Sensory Function: Impaired motor;Impaired sensory Oral impairment: Left labial;Right labial;Left facial;Right facial;Left lingual;Right lingual (masked facies, weak phonation/cough, standing secretions in mouth) Self-Feeding Abilities: Needs assist Patient Positioning: Upright in chair Volitional Cough: Weak Volitional Swallow: Unable to elicit Anatomy: Within functional limits Pharyngeal Secretions: Standing secretions in (comment)    Reason for Referral Objectively evaluate swallowing function   Oral Phase Oral Preparation/Oral Phase Oral Phase: Impaired Oral - Nectar Oral - Nectar Teaspoon: Weak lingual manipulation;Lingual pumping;Reduced posterior propulsion;Lingual/palatal residue;Delayed oral transit;Left anterior bolus loss Oral - Nectar Cup: Delayed oral transit;Weak lingual manipulation;Lingual pumping;Lingual/palatal residue;Reduced posterior propulsion Oral - Thin Oral - Thin Teaspoon: Lingual/palatal residue;Weak lingual manipulation;Lingual pumping;Delayed oral transit Oral - Thin Cup: Reduced posterior propulsion;Lingual/palatal residue;Delayed oral transit;Weak lingual manipulation;Lingual pumping Oral - Thin Straw: Weak lingual manipulation;Lingual pumping;Reduced posterior propulsion;Lingual/palatal residue;Delayed oral transit Oral - Solids Oral - Puree: Weak lingual  manipulation;Delayed oral transit;Lingual/palatal residue;Reduced posterior propulsion;Lingual pumping Oral - Regular: Weak lingual manipulation;Lingual pumping;Delayed oral transit;Lingual/palatal residue;Reduced posterior propulsion   Pharyngeal Phase Pharyngeal Phase Pharyngeal Phase: Impaired Pharyngeal - Nectar Pharyngeal - Nectar Teaspoon: Reduced pharyngeal peristalsis;Reduced epiglottic inversion;Reduced anterior laryngeal mobility;Reduced laryngeal elevation;Reduced airway/laryngeal closure;Pharyngeal residue - valleculae;Pharyngeal residue - pyriform sinuses;Moderate aspiration;Penetration/Aspiration after swallow;Reduced tongue base retraction;Penetration/Aspiration during swallow;Premature spillage to valleculae Penetration/Aspiration details (nectar teaspoon): Material enters airway, passes BELOW cords and not ejected out despite cough attempt by patient;Material enters airway, passes BELOW cords without attempt by patient to eject out (silent aspiration) Pharyngeal - Nectar Cup: Pharyngeal residue - valleculae;Pharyngeal residue - pyriform sinuses;Moderate aspiration;Reduced anterior laryngeal mobility;Reduced epiglottic inversion;Reduced pharyngeal peristalsis;Reduced laryngeal elevation;Reduced airway/laryngeal closure;Reduced tongue base retraction;Premature spillage to valleculae Pharyngeal - Thin Pharyngeal - Thin Teaspoon: Reduced anterior laryngeal mobility;Reduced epiglottic inversion;Reduced pharyngeal peristalsis;Reduced laryngeal elevation;Penetration/Aspiration during swallow;Penetration/Aspiration after swallow;Premature spillage to valleculae Penetration/Aspiration details (thin teaspoon): Material enters airway, passes BELOW cords without attempt by patient to eject out (silent aspiration) Pharyngeal - Thin Cup: Moderate aspiration;Penetration/Aspiration during swallow;Penetration/Aspiration after swallow;Reduced tongue base retraction;Reduced epiglottic inversion;Reduced  pharyngeal peristalsis;Reduced anterior laryngeal mobility;Reduced laryngeal elevation;Reduced airway/laryngeal closure;Pharyngeal residue - valleculae;Pharyngeal residue - pyriform sinuses Penetration/Aspiration details (thin cup): Material enters airway, passes BELOW cords without attempt by patient to eject out (silent aspiration);Material enters airway, passes BELOW cords and not ejected out despite cough attempt by patient Pharyngeal - Thin Straw: Premature spillage to valleculae;Pharyngeal residue - valleculae;Pharyngeal residue - pyriform sinuses;Penetration/Aspiration after swallow;Moderate aspiration;Pharyngeal residue - cp segment;Reduced laryngeal elevation;Reduced airway/laryngeal closure;Reduced tongue base retraction;Reduced anterior laryngeal mobility;Reduced epiglottic inversion;Reduced pharyngeal peristalsis Penetration/Aspiration details (thin straw): Material enters airway, passes BELOW cords without attempt by patient to eject out (silent aspiration) Pharyngeal - Solids Pharyngeal - Puree: Premature  spillage to valleculae;Reduced pharyngeal peristalsis;Reduced epiglottic inversion;Reduced anterior laryngeal mobility;Reduced tongue base retraction;Reduced laryngeal elevation;Pharyngeal residue - valleculae;Pharyngeal residue - pyriform sinuses Pharyngeal - Regular: Premature spillage to valleculae;Reduced pharyngeal peristalsis;Reduced epiglottic inversion;Reduced laryngeal elevation;Reduced tongue base retraction;Reduced anterior laryngeal mobility;Reduced airway/laryngeal closure;Pharyngeal residue - valleculae;Pharyngeal residue - pyriform sinuses  Cervical Esophageal Phase    GO    Cervical Esophageal Phase Cervical Esophageal Phase: Impaired Cervical Esophageal Phase - Nectar Nectar Teaspoon: Reduced cricopharyngeal relaxation Nectar Cup: Reduced cricopharyngeal relaxation Cervical Esophageal Phase - Thin Thin Teaspoon: Reduced cricopharyngeal relaxation Thin Cup: Reduced  cricopharyngeal relaxation Thin Straw: Reduced cricopharyngeal relaxation Cervical Esophageal Phase - Solids Puree: Reduced cricopharyngeal relaxation Regular: Reduced cricopharyngeal relaxation Cervical Esophageal Phase - Comment Cervical Esophageal Comment: appearance of decreased clearance into proximal esophagus due to decreased LE- trace delayed clearance - but oropharyngeal dysphagia appears to be primary deficit         Luanna Salk, [facility] SLP [phone]  RN made aware of test results immediately after completion.

## 2014-04-13 DIAGNOSIS — J189 Pneumonia, unspecified organism: Principal | ICD-10-CM

## 2014-04-13 DIAGNOSIS — Z515 Encounter for palliative care: Secondary | ICD-10-CM

## 2014-04-13 LAB — BASIC METABOLIC PANEL
BUN: 28 mg/dL — ABNORMAL HIGH (ref 6–23)
CALCIUM: 10.8 mg/dL — AB (ref 8.4–10.5)
CO2: 20 mEq/L (ref 19–32)
Chloride: 106 mEq/L (ref 96–112)
Creatinine, Ser: 1.61 mg/dL — ABNORMAL HIGH (ref 0.50–1.35)
GFR calc Af Amer: 46 mL/min — ABNORMAL LOW (ref >90)
GFR, EST NON AFRICAN AMERICAN: 40 mL/min — AB (ref >90)
Glucose, Bld: 64 mg/dL — ABNORMAL LOW (ref 70–99)
POTASSIUM: 5.3 meq/L (ref 3.7–5.3)
Sodium: 142 mEq/L (ref 137–147)

## 2014-04-13 MED ORDER — SODIUM CHLORIDE 0.9 % IV SOLN
60.0000 mg | Freq: Once | INTRAVENOUS | Status: DC
  Filled 2014-04-13: qty 20

## 2014-04-13 MED ORDER — MORPHINE SULFATE 2 MG/ML IJ SOLN
2.0000 mg | INTRAMUSCULAR | Status: DC | PRN
  Administered 2014-04-13: 2 mg via INTRAVENOUS
  Filled 2014-04-13: qty 1

## 2014-04-13 MED ORDER — SODIUM CHLORIDE 0.9 % IV SOLN
60.0000 mg | Freq: Once | INTRAVENOUS | Status: AC
  Administered 2014-04-13: 60 mg via INTRAVENOUS
  Filled 2014-04-13: qty 6.67

## 2014-04-13 MED ORDER — LORAZEPAM 2 MG/ML IJ SOLN
1.0000 mg | INTRAMUSCULAR | Status: DC | PRN
  Administered 2014-04-13: 1 mg via INTRAVENOUS
  Filled 2014-04-13: qty 1

## 2014-04-13 MED ORDER — ENOXAPARIN SODIUM 40 MG/0.4ML ~~LOC~~ SOLN
40.0000 mg | SUBCUTANEOUS | Status: DC
  Filled 2014-04-13: qty 0.4

## 2014-04-13 MED ORDER — SCOPOLAMINE 1 MG/3DAYS TD PT72
1.0000 | MEDICATED_PATCH | TRANSDERMAL | Status: DC | PRN
  Filled 2014-04-13: qty 1

## 2014-04-13 MED ORDER — ACETAMINOPHEN 650 MG RE SUPP
650.0000 mg | Freq: Four times a day (QID) | RECTAL | Status: DC | PRN
  Administered 2014-04-13 – 2014-04-14 (×2): 650 mg via RECTAL
  Filled 2014-04-13 (×2): qty 1

## 2014-04-13 MED ORDER — DEXTROSE 5 % IV SOLN
INTRAVENOUS | Status: DC
  Administered 2014-04-13: 09:00:00 via INTRAVENOUS

## 2014-04-13 NOTE — Progress Notes (Signed)
Subjective: Complains of right hip and mild left flank pain (site of biopsy).  No other new complaints.  Objective: Vital signs in last 24 hours: Temp:  [97.4 F (36.3 C)-97.6 F (36.4 C)] 97.4 F (36.3 C) (05/22 2236) Pulse Rate:  [78-100] 87 (05/22 2316) Resp:  [16-18] 18 (05/22 2236) BP: (93-94)/(61-64) 94/61 mmHg (05/22 2236) SpO2:  [97 %] 97 % (05/22 2316) Weight change:  Last BM Date: 03/22/14  CBG (last 3)   Recent Labs  03/24/2014 1146  GLUCAP 79    Intake/Output from previous day: 05/22 0701 - 05/23 0700 In: -  Out: 525 [Urine:525] Intake/Output this shift: Total I/O In: -  Out: 275 [Urine:275]  General appearance: dysarthric; oriented to events of hospitalization Eyes: no scleral icterus Throat: oropharynx with thick coating Resp: clear to auscultation bilaterally Cardio: regular rate and rhythm GI: soft, non-tender; bowel sounds normal; no masses,  no organomegaly Extremities: no clubbing, cyanosis or edema  Lab Results:  Recent Labs  03/24/2014 1138 04/11/14 0521  NA 138 140  K 5.1 4.8  CL 99 101  CO2 20 21  GLUCOSE 82 75  BUN 40* 38*  CREATININE 2.09* 1.96*  CALCIUM 11.1* 10.8*    Recent Labs  04/17/2014 1138 04/11/14 0521  AST 17 15  ALT 7 <5  ALKPHOS 99 92  BILITOT 0.5 0.6  PROT 8.6* 8.0  ALBUMIN 2.7* 2.4*    Recent Labs  04/08/2014 1138 04/11/14 0521  WBC 16.5* 13.3*  HGB 10.1* 9.3*  HCT 32.1* 30.2*  MCV 80.9 82.1  PLT 431* 352   Lab Results  Component Value Date   INR 1.34 04/17/2014   INR 1.23 04/18/2014    Recent Labs  03/29/2014 1138  TROPONINI <0.30   No results found for this basename: TSH, T4TOTAL, FREET3, T3FREE, THYROIDAB,  in the last 72 hours No results found for this basename: VITAMINB12, FOLATE, FERRITIN, TIBC, IRON, RETICCTPCT,  in the last 72 hours  Studies/Results: US Renal  04/11/2014   CLINICAL DATA:  Acute renal insufficiency, lung cancer  EXAM: RENAL/URINARY TRACT ULTRASOUND COMPLETE  COMPARISON:   PET-CT 04/01/2014, CTA abdomen 09/25/2013  FINDINGS: Right Kidney:  Length: 10.3 cm. Normal cortical thickness RIGHT kidney. Slightly increased cortical echogenicity. Tiny complicated appearing cyst at at upper pole 8 x 8 x 6 mm. No definite hydronephrosis or shadowing calcifications. No additional intra renal masses.  Left Kidney:  Length: 11.1 cm. Normal cortical thickness. Increased cortical echogenicity. No intra renal mass, hydronephrosis, or shadowing calcification.  Bladder:  Appears normal for degree of bladder distention.  Large complex RIGHT suprarenal mass 11.9 x 10.6 x 9.5 cm containing solid and cystic components and corresponding to a hypermetabolic RIGHT adrenal mass seen on prior PET-CT.  Additionally, large complex LEFT suprarenal mass 9.5 x 10.2 x 8.3 cm containing solid and cystic components, corresponding to a hypermetabolic LEFT adrenal mass identified on prior CT.  Sludge within gallbladder.  IMPRESSION: Medical renal disease changes of both kidneys without hydronephrosis.  Large complex BILATERAL adrenal masses as above question adrenal metastases in patient with pulmonary malignancy   Electronically Signed   By: Lavonia Dana M.D.   On: 04/11/2014 17:16   Ct Biopsy  04/11/2014   CLINICAL DATA:  80 year old male with metastatic malignancy of uncertain primary. Based on imaging, the primary lesion is likely a dominant left upper lobe pulmonary mass. Percutaneous biopsy of a hypermetabolic soft tissue nodule in the left lower quadrant retroperitoneal spaces indicated to confirm tissue diagnosis.  EXAM: CT GUIDED NEEDLE CORE BIOPSY OF LEFT LOWER QUADRANT RETROPERITONEAL NODULE  Date: 04/11/2018  ANESTHESIA/SEDATION: None required  PROCEDURE: The procedure risks, benefits, and alternatives were explained to the patient. Questions regarding the procedure were encouraged and answered. The patient understands and consents to the procedure.  The operative field was prepped with  Betadine/chlorhexidinein a sterile fashion, and a sterile drape was applied covering the operative field. Local anesthesia was provided with 1% Lidocaine.  Planning axial CT scan was performed a suitable skin entry site selected and marked. A small dermatotomy was made at the anesthetized skin site. Under intermittent CT fluoroscopic guidance, an 17 gauge trocar needle was advanced to the margin of the soft tissue nodule. Multiple 18 gauge core biopsies were then coaxially obtained using the BioPince automated biopsy device.  The biopsy device was removed.  There was no immediate complication.  Complications: None  FINDINGS: Successful core biopsy of left lower quadrant retroperitoneal soft tissue nodule. Of note, the biopsy fragments were very soft and a gelatinous.  IMPRESSION: Technically successful CT-guided core biopsy of hypermetabolic soft tissue nodule in the left lower quadrant retroperitoneal space. This nodule was selected as it had the least amount of central necrosis on the recently obtained PET-CT an should have the most likely at of providing a diagnostic sample.  Final pathology is pending.  Signed,  Criselda Peaches, MD  [facility]  [facility]   Electronically Signed   By: Jacqulynn Cadet M.D.   On: 04/11/2014 17:08   Dg Swallowing Func-speech Pathology  04/12/2014   Macario Golds, CCC-SLP     04/12/2014  1:59 PM Objective Swallowing Evaluation: Modified Barium Swallowing Study   Patient Details  Name: Jose Vega MRN: [id number] Date of Birth: Nov 14, 1934  Today's Date: 04/12/2014 Time: 1230-1307 SLP Time Calculation (min): 37 min  Past Medical History:  Past Medical History  Diagnosis Date  . Parkinson's disease   . Hypertension   . AAA (abdominal aortic aneurysm)   . Spondylolisthesis   . Lumbar degenerative disc disease   . Lung mass   . COPD (chronic obstructive pulmonary disease)    Past Surgical History:  Past Surgical History   Procedure Laterality Date  . Total knee arthroplasty      bilateral  . Hernia repair  7 years ago  . Joint replacement    . Esophagogastroduodenoscopy N/A 10/30/2013    Procedure: ESOPHAGOGASTRODUODENOSCOPY (EGD);  Surgeon: Lear Ng, MD;  Location: [facility];  Service: Endoscopy;   Laterality: N/A;   HPI:  Pt was brought to the because of progressive decline in overall  function over the past few days. He has been eating and drinking  very little in the past 2 weeks and has been losing weight per MD  note.  Pt stated to MD that he just is not hungry, and that he  does not have problems swallowing. He has also had more difficult  to comprehend speech over the past few days and extreme  difficulty ambulating. Over the past one to 2 months he has been  diagnosed with a left upper lobe lung mass with recent  postobstructive pneumonia per MD note. He had a recent PET scan  with CT that was highly suggestive of bronchogenic carcinoma with  metastases to the bilateral adrenal glands. MD ordered MBS due to  concern for dysphagia.       Assessment / Plan / Recommendation Clinical Impression  Dysphagia Diagnosis: Severe oral  phase dysphagia;Moderate oral  phase dysphagia;Severe pharyngeal phase dysphagia Clinical impression: Moderately severe oropharyngeal dysphagia  characterized by significant sensorimotor deficits resulting in  severe weakness with delayed oral transiting, oropharyngeal  stasis and chronic penetration/aspiration.  Pt is severely  dysarthric and nearly incomprehensible.  Unfortunately, pt's  reflexive and volitional cough/throat clear and dry swallow are  weak and nonprotective.   Following solids with liquids  faciliated pharyngeal clearance but with aspiration of liquids.   Various strategies attempted to miniimize aspiration including  head turn left and chin tuck without significant benefit.  Using  video monitor and verbal feedback, SLP educated pt to findings  and compenstion strategies.   SLP questions if pt's dysphagia has  contributed to his weight loss and pulmonary issue *? pna + lung  carcinoma per chart review.  Rec MD speak to pt re: his level of  dysphagia and options as he will aspirate with any consistency  based on MBS findings.  Anticipate poor tolerance of aspiration  due to pt's deconditioning, medical dx (? carcinoma) and  progressive neurological dx.       Treatment Recommendation  Therapy as outlined in treatment plan below    Diet Recommendation NPO;Ice chips PRN after oral care (defer to  md for further plans) ? Repeat mbs with clinical improvement?        Other  Recommendations Oral Care Recommendations: Oral care Q4  per protocol   Follow Up Recommendations    tbd, ? snf   Frequency and Duration min 2x/week  1 week   Pertinent Vitals/Pain Afebrile, decreased    SLP Swallow Goals     General Date of Onset: 04/12/14 HPI: Pt was brought to the because of progressive decline in  overall function over the past few days. He has been eating and  drinking very little in the past 2 weeks and has been losing  weight per MD note.  Pt stated to MD that he just is not hungry,  and that he does not have problems swallowing. He has also had  more difficult to comprehend speech over the past few days and  extreme difficulty ambulating. Over the past one to 2 months he  has been diagnosed with a left upper lobe lung mass with recent  postobstructive pneumonia per MD note. He had a recent PET scan  with CT that was highly suggestive of bronchogenic carcinoma with  metastases to the bilateral adrenal glands. MD ordered MBS due to  concern for dysphagia.   Type of Study: Modified Barium Swallowing Study Reason for Referral: Objectively evaluate swallowing function Diet Prior to this Study: Regular;Thin liquids Temperature Spikes Noted: No Respiratory Status: Room air History of Recent Intubation: No Behavior/Cognition: Alert Oral Cavity - Dentition: Missing dentition;Poor condition (lower   dentition - no upper) Oral Motor / Sensory Function: Impaired motor;Impaired sensory Oral impairment: Left labial;Right labial;Left facial;Right  facial;Left lingual;Right lingual (masked facies, weak  phonation/cough, standing secretions in mouth) Self-Feeding Abilities: Needs assist Patient Positioning: Upright in chair Volitional Cough: Weak Volitional Swallow: Unable to elicit Anatomy: Within functional limits Pharyngeal Secretions: Standing secretions in (comment)    Reason for Referral Objectively evaluate swallowing function   Oral Phase Oral Preparation/Oral Phase Oral Phase: Impaired Oral - Nectar Oral - Nectar Teaspoon: Weak lingual manipulation;Lingual  pumping;Reduced posterior propulsion;Lingual/palatal  residue;Delayed oral transit;Left anterior bolus loss Oral - Nectar Cup: Delayed oral transit;Weak lingual  manipulation;Lingual pumping;Lingual/palatal residue;Reduced  posterior propulsion Oral - Thin Oral - Thin Teaspoon: Lingual/palatal residue;Weak lingual  manipulation;Lingual pumping;Delayed oral transit Oral - Thin Cup: Reduced posterior propulsion;Lingual/palatal  residue;Delayed oral transit;Weak lingual manipulation;Lingual  pumping Oral - Thin Straw: Weak lingual manipulation;Lingual  pumping;Reduced posterior propulsion;Lingual/palatal  residue;Delayed oral transit Oral - Solids Oral - Puree: Weak lingual manipulation;Delayed oral  transit;Lingual/palatal residue;Reduced posterior  propulsion;Lingual pumping Oral - Regular: Weak lingual manipulation;Lingual pumping;Delayed  oral transit;Lingual/palatal residue;Reduced posterior propulsion   Pharyngeal Phase Pharyngeal Phase Pharyngeal Phase: Impaired Pharyngeal - Nectar Pharyngeal - Nectar Teaspoon: Reduced pharyngeal  peristalsis;Reduced epiglottic inversion;Reduced anterior  laryngeal mobility;Reduced laryngeal elevation;Reduced  airway/laryngeal closure;Pharyngeal residue -  valleculae;Pharyngeal residue - pyriform sinuses;Moderate   aspiration;Penetration/Aspiration after swallow;Reduced tongue  base retraction;Penetration/Aspiration during swallow;Premature  spillage to valleculae Penetration/Aspiration details (nectar teaspoon): Material enters  airway, passes BELOW cords and not ejected out despite cough  attempt by patient;Material enters airway, passes BELOW cords  without attempt by patient to eject out (silent aspiration) Pharyngeal - Nectar Cup: Pharyngeal residue -  valleculae;Pharyngeal residue - pyriform sinuses;Moderate  aspiration;Reduced anterior laryngeal mobility;Reduced epiglottic  inversion;Reduced pharyngeal peristalsis;Reduced laryngeal  elevation;Reduced airway/laryngeal closure;Reduced tongue base  retraction;Premature spillage to valleculae Pharyngeal - Thin Pharyngeal - Thin Teaspoon: Reduced anterior laryngeal  mobility;Reduced epiglottic inversion;Reduced pharyngeal  peristalsis;Reduced laryngeal elevation;Penetration/Aspiration  during swallow;Penetration/Aspiration after swallow;Premature  spillage to valleculae Penetration/Aspiration details (thin teaspoon): Material enters  airway, passes BELOW cords without attempt by patient to eject  out (silent aspiration) Pharyngeal - Thin Cup: Moderate aspiration;Penetration/Aspiration  during swallow;Penetration/Aspiration after swallow;Reduced  tongue base retraction;Reduced epiglottic inversion;Reduced  pharyngeal peristalsis;Reduced anterior laryngeal  mobility;Reduced laryngeal elevation;Reduced airway/laryngeal  closure;Pharyngeal residue - valleculae;Pharyngeal residue -  pyriform sinuses Penetration/Aspiration details (thin cup): Material enters  airway, passes BELOW cords without attempt by patient to eject  out (silent aspiration);Material enters airway, passes BELOW  cords and not ejected out despite cough attempt by patient Pharyngeal - Thin Straw: Premature spillage to  valleculae;Pharyngeal residue - valleculae;Pharyngeal residue -  pyriform  sinuses;Penetration/Aspiration after swallow;Moderate  aspiration;Pharyngeal residue - cp segment;Reduced laryngeal  elevation;Reduced airway/laryngeal closure;Reduced tongue base  retraction;Reduced anterior laryngeal mobility;Reduced epiglottic  inversion;Reduced pharyngeal peristalsis Penetration/Aspiration details (thin straw): Material enters  airway, passes BELOW cords without attempt by patient to eject  out (silent aspiration) Pharyngeal - Solids Pharyngeal - Puree: Premature spillage to valleculae;Reduced  pharyngeal peristalsis;Reduced epiglottic inversion;Reduced  anterior laryngeal mobility;Reduced tongue base  retraction;Reduced laryngeal elevation;Pharyngeal residue -  valleculae;Pharyngeal residue - pyriform sinuses Pharyngeal - Regular: Premature spillage to valleculae;Reduced  pharyngeal peristalsis;Reduced epiglottic inversion;Reduced  laryngeal elevation;Reduced tongue base retraction;Reduced  anterior laryngeal mobility;Reduced airway/laryngeal  closure;Pharyngeal residue - valleculae;Pharyngeal residue -  pyriform sinuses  Cervical Esophageal Phase    GO    Cervical Esophageal Phase Cervical Esophageal Phase: Impaired Cervical Esophageal Phase - Nectar Nectar Teaspoon: Reduced cricopharyngeal relaxation Nectar Cup: Reduced cricopharyngeal relaxation Cervical Esophageal Phase - Thin Thin Teaspoon: Reduced cricopharyngeal relaxation Thin Cup: Reduced cricopharyngeal relaxation Thin Straw: Reduced cricopharyngeal relaxation Cervical Esophageal Phase - Solids Puree: Reduced cricopharyngeal relaxation Regular: Reduced cricopharyngeal relaxation Cervical Esophageal Phase - Comment Cervical Esophageal Comment: appearance of decreased clearance  into proximal esophagus due to decreased LE- trace delayed  clearance - but oropharyngeal dysphagia appears to be primary  deficit         Luanna Salk, [facility] SLP [phone]  RN made aware of test results immediately after completion.      Medications:  Scheduled: . Carbidopa-Levodopa ER  2 tablet Oral TID  . enoxaparin (LOVENOX) injection  30 mg Subcutaneous Q24H  . piperacillin-tazobactam (ZOSYN)  IV  3.375 g  Intravenous 3 times per day  . sodium chloride  3 mL Intravenous Q12H   Continuous: . 0.9 % NaCl with KCl 20 mEq / L 100 mL/hr (04/12/14 0830)    Assessment/Plan: Principal Problem: 1.  CAP (community acquired pneumonia)- suspect he may have a component of aspiration vs. Post-obstructive PNA.  Continue Zosyn. Active Problems: 2. Lung mass with bilateral adrenal, left retroperitoneal metastases- suspect small cell lung cancer.  Pathology pending.  Poor performance status at baseline makes him a poor candidate for chemotherapy.  Will consult palliative care to assist with goals of therapy and transition to palliative care. 3. Altered mental status- seems improved with hydration.   4. Acute kidney injury- slightly improved with hydration. 5. Dysphagia, pharyngoesophageal phase- high risk for aspiration.  Briefly discussed feeding options with patient- lacks insight as far as I can tell.  Will discuss further with wife in context of palliative care goals of therapy discussion.  I would not advocate for PEG tube given frail status. 6. Hypercalcemia- continue IV fluids.  Will give pamidronate X 1- may help with pain and mental status.   7. Parkinson disease- progressive functional decline at baseline. 8. Anemia- Recheck in AM 9. Gait instability- cotninue PT/OT.  Will need SNF. 8. Protein-calorie malnutrition, severe- NPO currently.  Continue IV fluid hydration (change to D5W) pending family discussion regarding goals of therapy.      LOS: 3 days   Marton Redwood 04/13/2014, 6:12 AM

## 2014-04-13 NOTE — Progress Notes (Signed)
Palliative Medicine   Palliative consult received. I have reviewed his chart in detail. I approached patient and his wife to discuss goals of care-- It became immediately obvious to me that neither the patient nor his wife have decision making capacity. His wife tells me that she is "having problems with her memory too". She has almost no insight into her husbands condition, she repeats herself, she is having trouble with remembering details of his hospitalization. She is shocked to hear that her husband has cancer, she cannot tell me why they did a biopsy. The patient has two sons who live in [place] (this is a second Jose Vega has been married to his current wife for the past 7 years per her report and she struggles to answer that question). His wife tells me that his son should be here at the hospital around 2PM-they have not arrived. I delivered the news regarding her husbands prognosis and especially with hypercalcemia and functional status decline. I asked for his sons to be present before discussing next steps-I am concerned about his wife's ability to process and understand information. I suspect they have both been struggling at home for quite some time- and Im not surprised that there is an issue with an unpaid bill from [employer] I doubt either of them have been able to manage bills and financial affairs very well. Will await his sons arrival- nurse will page me if they arrive-otherwise I will call his son to discuss next steps- note: patient's wife has son from a prior marriage- wife reports no family conflicts. Thank you for this consultation- will work through the complexities here and come up with a plan for what will most likely be his EOL care.  Lane Hacker, DO Palliative Medicine

## 2014-04-13 NOTE — Progress Notes (Signed)
ANTIBIOTIC CONSULT NOTE - FOLLOW UP  Pharmacy Consult for zosyn Indication: post-obstructive pneumonia  Allergies  Allergen Reactions  . Aspirin Adult Low [Aspirin] Other (See Comments)    GI BLEED/ulcers  . Ibuprofen     Irritates stomach    Patient Measurements: Height: 5\' 9"  (175.3 cm) Weight: 162 lb 7.7 oz (73.7 kg) IBW/kg (Calculated) : 70.7 Adjusted Body Weight:   Vital Signs: Temp: 97.5 F (36.4 C) (05/23 0272) Temp src: Oral (05/23 5366) BP: 86/59 mmHg (05/23 4403) Pulse Rate: 85 (05/23 4742) Intake/Output from previous day: 05/22 0701 - 05/23 0700 In: -  Out: 525 [Urine:525] Intake/Output from this shift:    Labs:  Recent Labs  04/11/14 0521 04/13/14 0452  WBC 13.3*  --   HGB 9.3*  --   PLT 352  --   CREATININE 1.96* 1.61*   Estimated Creatinine Clearance: 38.4 ml/min (by C-G formula based on Cr of 1.61). No results found for this basename: VANCOTROUGH, Corlis Leak, VANCORANDOM, GENTTROUGH, GENTPEAK, GENTRANDOM, TOBRATROUGH, TOBRAPEAK, TOBRARND, AMIKACINPEAK, AMIKACINTROU, AMIKACIN,  in the last 72 hours   Microbiology: Recent Results (from the past 720 hour(s))  URINE CULTURE     Status: None   Collection Time    03/22/2014  1:30 PM      Result Value Ref Range Status   Specimen Description URINE, CATHETERIZED   Final   Special Requests NONE   Final   Culture  Setup Time     Final   Value: 04/11/2014 01:46     Performed at [place]     Final   Value: NO GROWTH     Performed at [facility]   Culture     Final   Value: NO GROWTH     Performed at [facility]   Report Status 04/12/2014 FINAL   Final  CULTURE, BLOOD (ROUTINE X 2)     Status: None   Collection Time    04/11/2014  1:42 PM      Result Value Ref Range Status   Specimen Description BLOOD LEFT ANTECUBITAL   Final   Special Requests BOTTLES DRAWN AEROBIC AND ANAEROBIC 4ML   Final   Culture  Setup Time     Final   Value: 03/24/2014 17:07   Performed at [facility]   Culture     Final   Value:        BLOOD CULTURE RECEIVED NO GROWTH TO DATE CULTURE WILL BE HELD FOR 5 DAYS BEFORE ISSUING A FINAL NEGATIVE REPORT     Performed at [facility]   Report Status PENDING   Incomplete  CULTURE, BLOOD (ROUTINE X 2)     Status: None   Collection Time    04/12/2014  1:44 PM      Result Value Ref Range Status   Specimen Description BLOOD RIGHT ANTECUBITAL   Final   Special Requests BOTTLES DRAWN AEROBIC AND ANAEROBIC 5CC   Final   Culture  Setup Time     Final   Value: 03/28/2014 17:07     Performed at [facility]   Culture     Final   Value:        BLOOD CULTURE RECEIVED NO GROWTH TO DATE CULTURE WILL BE HELD FOR 5 DAYS BEFORE ISSUING A FINAL NEGATIVE REPORT     Performed at [facility]   Report Status PENDING   Incomplete    Anti-infectives   Start  Dose/Rate Route Frequency Ordered Stop   04/09/2014 2300  piperacillin-tazobactam (ZOSYN) IVPB 3.375 g     3.375 g 12.5 mL/hr over 240 Minutes Intravenous 3 times per day 04/17/2014 1437     03/27/2014 1500  piperacillin-tazobactam (ZOSYN) IVPB 3.375 g     3.375 g 100 mL/hr over 30 Minutes Intravenous STAT 04/18/2014 1437 03/27/2014 1532      Assessment: 80yo M from home with AMS and rib pain. Recently diagnosed with a lung mass. CXR showed post-obstructive pneumonia. Pharmacy is asked to dose Zosyn.   5/20 >> Zosyn >>  Tmax: Afeb WBCs: improved to 13.3 Renal: SCr 1.61, est CrCl = 59ml/min  5/20 urine: neg 5/20 BCx x2: NGTD  Goal of Therapy:  Dose appropriately for renal function and indication  Plan:  Day #4 antibiotics  Continue zosyn 3.375gm IV q8h over 4h infusion  Pharmacy to follow at distance and will write note if dose change indicated  Suggest narrowing of antibiotics  Doreene Eland, PharmD, BCPS.   Pager: [phone] 04/13/2014,12:44 PM

## 2014-04-13 NOTE — Consult Note (Signed)
@LOGODEPT @ Palliative Medicine Team at [facility]  Date: 04/13/2014   Patient Name: Jose Vega  DOB: [birth year]  MRN: [id number]  Age / Sex: 80 y.o., male   PCP: Leanna Battles, MD Referring Physician: Leanna Battles, MD  Active Problems: Principal Problem: Metastatic Cancer-Lung, Abdominal metastatic disease   Post obstructive PNA vs. CAP (community acquired pneumonia)  Active Problems:   Parkinson disease   Anemia   Gait instability   Protein-calorie malnutrition, severe   Lung mass   Altered mental status   Acute kidney injury   Dysphagia, pharyngoesophageal phase   Hypercalcemia   Delirium   HPI/Reason for Consultation: Jose Vega is a 80 yo man with advanced Parkinson's Disease and progressive dementia admitted with weakness, fall and AMS. He has recently been diagnosed with a lung mass with adrenal mets and had a scheduled biospy but was admitted prior to that occuring. Biopsy was done during this hospitalization and pathology is pending. His mental status has deteriorated since admission despite IV hydration and treatment of his hypercalcemia and empiric treatment for PNA. PMT consulted for Goals of Care.  Participants in Discussion: Wife, Patient's Son and DIL  Goals/Summary of Case:   Advance Directive: No formal directives   Code Status Orders        Start     Ordered   04/12/14 [id number]  Do not attempt resuscitation (DNR)   Continuous    Question Answer Comment  Maintain current active treatments Yes   Do not initiate new interventions Yes      04/12/14 0741      I have reviewed the medical record, interviewed the patient and family, and examined the patient. The following aspects are pertinent.  Past Medical History  Diagnosis Date  . Parkinson's disease   . Hypertension   . AAA (abdominal aortic aneurysm)   . Spondylolisthesis   . Lumbar degenerative disc disease   . Lung mass   . COPD (chronic obstructive pulmonary disease)     History    Social History  . Marital Status: Married    Spouse Name: Vaughan Basta    Number of Children: 3  . Years of Education: College   Occupational History  . Retired    Social History Main Topics  . Smoking status: Current Some Day Smoker    Types: Cigars  . Smokeless tobacco: Never Used     Comment: 2-3 cigars daily  . Alcohol Use: 0.6 oz/week    1 Glasses of wine per week     Comment: 1 glass of wine at night  . Drug Use: No  . Sexual Activity: No   Other Topics Concern  . None   Social History Narrative   Patient lives at home with his spouse.   Caffeine Use: coffee and tea daily    Family History  Problem Relation Age of Onset  . Diabetes Father   . Diabetes Brother   . Cancer Brother   . Parkinsonism Mother    Scheduled Meds: . Carbidopa-Levodopa ER  2 tablet Oral TID  . sodium chloride  3 mL Intravenous Q12H   Continuous Infusions: . dextrose 100 mL/hr at 04/13/14 0842   PRN Meds:.acetaminophen, alum & mag hydroxide-simeth, bisacodyl, morphine injection, ondansetron (ZOFRAN) IV Allergies  Allergen Reactions  . Aspirin Adult Low [Aspirin] Other (See Comments)    GI BLEED/ulcers  . Ibuprofen     Irritates stomach   CBC:    Component Value Date/Time   WBC 13.3* 04/11/2014 5573  HGB 9.3* 04/11/2014 0521   HCT 30.2* 04/11/2014 0521   PLT 352 04/11/2014 0521   MCV 82.1 04/11/2014 0521   NEUTROABS 8.9* 10/03/2012 1647   LYMPHSABS 1.1 10/03/2012 1647   MONOABS 1.0 10/03/2012 1647   EOSABS 0.3 10/03/2012 1647   BASOSABS 0.0 10/03/2012 1647    Comprehensive Metabolic Panel:    Component Value Date/Time   NA 142 04/13/2014 0452   K 5.3 04/13/2014 0452   CL 106 04/13/2014 0452   CO2 20 04/13/2014 0452   BUN 28* 04/13/2014 0452   CREATININE 1.61* 04/13/2014 0452   CREATININE 1.26 09/24/2013 1322   GLUCOSE 64* 04/13/2014 0452   CALCIUM 10.8* 04/13/2014 0452   AST 15 04/11/2014 0521   ALT <5 04/11/2014 0521   ALKPHOS 92 04/11/2014 0521   BILITOT 0.6 04/11/2014 0521    PROT 8.0 04/11/2014 0521   ALBUMIN 2.4* 04/11/2014 0521   Vital Signs: BP 90/64  Pulse 114  Temp(Src) 97.3 F (36.3 C) (Oral)  Resp 18  Ht 5\' 9"  (1.753 m)  Wt 73.7 kg (162 lb 7.7 oz)  BMI 23.98 kg/m2  SpO2 93% Filed Weights   03/23/2014 1739  Weight: 73.7 kg (162 lb 7.7 oz)   Physical Exam:  General appearance: Pale, Frail, Minimally responsive Eyes: Pupils sluggish HENT: Atraumatic; oropharynx red, irritated Neck: Trachea midline; FROM, supple, no thyromegaly or lymphadenopathy Lungs: +rhonchi CV: Tachycardic Abdomen: Soft, non-tender; no masses or HSM Extremities: +edema Skin: No open wounds Psych: mostly unresponsive  Assessment/Prognosis: Primary Diagnoses  Metastatic Lung Cancer   Active Symptoms: Dyspnea Agitation Dysphagia-terminal  Prognosis: <2weeks  PPS 10  Scope of Treatment / Recommendations:  Full Comfort Care  Stop Antibiotics  Stop IV fluids  PRN Morphine  PRN Ativan  No additional work-up-focus is comfort and QOL  CSW consult for Residential Hospice Placement  I have expressed my concern for his wife's well being- she is driving and may be unsafe-she has had difficulty finding her car- family will take her home. Son Elta Guadeloupe is Tax adviser.   Time: 90 minutes  500PM-630PM Greater than 50%  of this time was spent counseling and coordinating care related to the above assessment and plan.  Signed by: Acquanetta Chain, DO  Acquanetta Chain, DO  04/13/2014, 6:19 PM  Please contact Palliative Medicine Team phone at [phone] for questions and concerns.

## 2014-04-14 DIAGNOSIS — R1900 Intra-abdominal and pelvic swelling, mass and lump, unspecified site: Secondary | ICD-10-CM

## 2014-04-14 DIAGNOSIS — E860 Dehydration: Secondary | ICD-10-CM

## 2014-04-14 DIAGNOSIS — J189 Pneumonia, unspecified organism: Secondary | ICD-10-CM

## 2014-04-14 DIAGNOSIS — R222 Localized swelling, mass and lump, trunk: Secondary | ICD-10-CM

## 2014-04-14 MED ORDER — LORAZEPAM 2 MG/ML IJ SOLN: 0.5000 mg | INTRAMUSCULAR | Status: DC | PRN

## 2014-04-14 MED ORDER — KETOROLAC TROMETHAMINE 15 MG/ML IJ SOLN
15.0000 mg | Freq: Once | INTRAMUSCULAR | Status: AC
  Administered 2014-04-14: 15 mg via INTRAVENOUS
  Filled 2014-04-14: qty 1

## 2014-04-14 MED ORDER — LORAZEPAM 2 MG/ML IJ SOLN: 0.5000 mg | Freq: Two times a day (BID) | INTRAMUSCULAR | Status: DC

## 2014-04-14 MED ORDER — MORPHINE SULFATE 2 MG/ML IJ SOLN: 1.0000 mg | INTRAMUSCULAR | Status: DC | PRN

## 2014-04-14 MED ORDER — MORPHINE SULFATE 2 MG/ML IJ SOLN
0.5000 mg | Freq: Once | INTRAMUSCULAR | Status: AC
  Administered 2014-04-14: 0.5 mg via INTRAVENOUS
  Filled 2014-04-14: qty 1

## 2014-04-16 LAB — CULTURE, BLOOD (ROUTINE X 2)
CULTURE: NO GROWTH
CULTURE: NO GROWTH

## 2014-04-22 NOTE — Progress Notes (Signed)
Pt had temp of 102.1.  Called MD and received new order for tylenol.  Temp decreased to 100 around 0000.  Now it is 103.8.  Tylenol given PR again.

## 2014-04-22 NOTE — Progress Notes (Signed)
Notified that patient had expired shortly after my visit this morning- paged by RN to return to bedside. Patient was found without spontaneous respirations, no heart beat was audible on auscultation, and no palpable carotid pulse. Patients wife was at bedside. Patient pronounced with RN at bedside. Time of Death 10:13 AM. Provided presence and comfort to patient's wife. I notified his son and have assisted with next steps. Chaplain called. Will provide bereavement information from Hospice. Dr. Brigitte Pulse notified by RN.  Lane Hacker, DO Palliative Medicine

## 2014-04-22 NOTE — Progress Notes (Signed)
Arrived after the patient died.  Spent time with the patient's spouse of about 15 years (second marriage for both).  Provided Social Security contact info per patient request.  Also talked about the future unfolding and the surfacing of grief and sadness over time, as well as the changes of which to be aware.  Mixed chat, counsel and inquiries.  Also spoke with th espouse's brother, as he revealed he lost his wife not too long ago.  He said he will be able to walk through this with his sister, as much as feasible living in [place].  He was furnished the local Hospice number for counseling for his sister.  Also spent time working on some feelings about his loss.  Loann Quill, Chaplain Pager: [phone]

## 2014-04-22 NOTE — Progress Notes (Addendum)
Palliative Care Team at [facility] Progress Note   SUBJECTIVE: Unresponsive. Febrile. Rapid decline overnight.  Interval Events: Comfort Care initiated 5/23   OBJECTIVE: Vital Signs: BP 84/63  Pulse 139  Temp(Src) 101.4 F (38.6 C) (Axillary)  Resp 18  Ht 5\' 9"  (1.753 m)  Wt 73.7 kg (162 lb 7.7 oz)  BMI 23.98 kg/m2  SpO2 90%   Intake and Output: 05/23 0701 - 05/24 0700 In: 2130 [I.V.:2130] Out: 800 [Urine:800]  Physical Exam: General: Pale. Chronically ill appearing.  Head: Normocephalic, atraumatic.  Lungs:  +rhonchi  Heart: Tachy  Abdomen:  BS normoactive. Soft, Nondistended, non-tender.  No masses or organomegaly.  Extremities: + edema.    Allergies  Allergen Reactions  . Aspirin Adult Low [Aspirin] Other (See Comments)    GI BLEED/ulcers  . Ibuprofen     Irritates stomach    Medications: Scheduled Meds:  . LORazepam  0.5 mg Intravenous Q12H  . sodium chloride  3 mL Intravenous Q12H    Continuous Infusions: . dextrose 100 mL/hr at 2014-05-02 0600    PRN Meds: acetaminophen, bisacodyl, LORazepam, morphine injection, ondansetron (ZOFRAN) IV, scopolamine  Stool Softner: yes   Palliative Performance Scale: 10%   Labs: CBC    Component Value Date/Time   WBC 13.3* 04/11/2014 0521   RBC 3.68* 04/11/2014 0521   HGB 9.3* 04/11/2014 0521   HCT 30.2* 04/11/2014 0521   PLT 352 04/11/2014 0521   MCV 82.1 04/11/2014 0521   MCH 25.3* 04/11/2014 0521   MCHC 30.8 04/11/2014 0521   RDW 16.8* 04/11/2014 0521   LYMPHSABS 1.1 10/03/2012 1647   MONOABS 1.0 10/03/2012 1647   EOSABS 0.3 10/03/2012 1647   BASOSABS 0.0 10/03/2012 1647    CMET     Component Value Date/Time   NA 142 04/13/2014 0452   K 5.3 04/13/2014 0452   CL 106 04/13/2014 0452   CO2 20 04/13/2014 0452   GLUCOSE 64* 04/13/2014 0452   BUN 28* 04/13/2014 0452   CREATININE 1.61* 04/13/2014 0452   CREATININE 1.26 09/24/2013 1322   CALCIUM 10.8* 04/13/2014 0452   PROT 8.0 04/11/2014 0521   ALBUMIN 2.4*  04/11/2014 0521   AST 15 04/11/2014 0521   ALT <5 04/11/2014 0521   ALKPHOS 92 04/11/2014 0521   BILITOT 0.6 04/11/2014 0521   GFRNONAA 40* 04/13/2014 0452   GFRAA 46* 04/13/2014 0452   ASSESSMENT/ PLAN: 80 yo man actively dying of metastatic cancer-probably small cell lung cancer as well as Parkinson's with Dementia-poor baseline functional status prior to cancer but he elected to have a biopsy done for tissue diagnosis. Admitted with a post obstructive PNA from the lung mass-has had a rapid decline since admission. Comfort care initiated yesterday. I recommended a Hospice facility for his care- he however may not survive this hospitalization-he is hypotensive and febrile this AM. I spoke with his step-daughter who is supporting Jose Vega's wife Jose Vega who is exhausted and having memory issues. Also updated Jose Vega at bedside, she is tearful-she is having difficulty coping-she has no support at bedside-I will call the chaplain. Also called patient's son re: decline.   Time: 35 minutes 9AM-938AM Greater than 50%  of this time was spent counseling and coordinating care related to the above assessment and plan.   Acquanetta Chain, DO  05-02-14, 9:23 AM  Please contact Palliative Medicine Team phone at [phone] for questions and concerns.

## 2014-04-22 DEATH — deceased

## 2014-05-07 NOTE — Discharge Summary (Signed)
Expiration Note  Jose Vega  MR#: [id number]  DOB:[birth year]  Date of Admission: 2014/05/01 Date of Death: 2014/05/28  Attending Physician:PATERSON,DANIEL G  Patient's PCP: Donnajean Lopes, MD  Consults:  Interventional Radiology, Palliative Care Medicine  Cause of Death: CAP (community acquired pneumonia)  Secondary Diagnoses Present on Admission:  . CAP (community acquired pneumonia) . Gait instability . Parkinson disease . Protein-calorie malnutrition, severe . Altered mental status . Lung mass . Acute kidney injury . Anemia . Dysphagia, pharyngoesophageal phase . Hypercalcemia   Hospital Course: Patient is a 80 year old man who is well-known to me as I service his primary care physician. He was brought to the emergency room today because of progressive decline in overall function over the past few days. He has been eating and drinking very little in the past 2 weeks and has been losing weight. He states that he just is not hungry, and that he does not have problems swallowing per se. He has also had more difficult to comprehend speech over the past few days and extreme difficulty ambulating. His wife transports him around their home with a wheelchair and he is able to do transfers to the toilet or bed. He also continues to have a dry cough without significant dyspnea at rest on room air. Over the past one to 2 months he has been diagnosed with a left upper lobe lung mass with recent postobstructive pneumonia. 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 Plans were underway for a CT-guided biopsy of the lung mass tomorrow. His wife is exhausted from trying to care for him in his weakened state, and brought him to the emergency room for evaluation. Earlier this spring he had a fall that resulted in a right humerus fracture and he has had a fall that caused several left-sided rib fractures.  He has a history of Parkinson's disease with fairly rapid progression of motor deficits, for which his local neurologist was planning on obtaining a second opinion from a [facility] neurologist. He also has a history of a significant upper GI bleed from a duodenal ulcer in December 2014. I discussed with he and his wife their wishes should he have cardiac arrest, and they both would prefer a DO NOT RESUSCITATE status.  He was admitted to a medical bed without telemetry.  It was apparent that he was quite weak at high risk for aspiration.  He was seen by consultants from physical therapy, occupational therapy, and speech pathology.  The speech pathologist did a modified barium swallow study that showed severe oral pharyngeal dysphagia and severe pharyngeal phase dysphagia, and she felt that he was at high risk for aspiration with all food consistencies tested.  A radiologist did a biopsy of the left retroperitoneal mass. The final pathology showed high-grade poorly differentiated carcinoma likely from a primary lung cancer.  On May 05, 2014 he had sudden cardiopulmonary arrest.  Given that he was under a DO NOT RESUSCITATE order he was kept as comfortable as possible.  There were no procedure related complications of his hospitalization.  Signed: Donnajean Lopes 05-28-2014, 3:23 PM

## 2014-05-21 ENCOUNTER — Ambulatory Visit: Payer: Medicare Other | Admitting: Vascular Surgery

## 2014-05-21 ENCOUNTER — Other Ambulatory Visit (HOSPITAL_COMMUNITY): Payer: Medicare Other

## 2014-08-27 ENCOUNTER — Ambulatory Visit: Payer: Medicare Other | Admitting: Neurology

## 2015-01-26 IMAGING — CT NM PET TUM IMG INITIAL (PI) SKULL BASE T - THIGH
1 of 9 series · 1 of 25 positions shown · non-contrast
Comparison: Chest CT on 03/20/2014 and abdomen pelvis CTA on
09/25/2013

CLINICAL DATA: Initial treatment strategy for left lung mass.

EXAM:
NUCLEAR MEDICINE PET SKULL BASE TO THIGH
TECHNIQUE: 10.1 mCi F-18 FDG was injected intravenously. Full-ring PET imaging
was performed from the skull base to thigh after the radiotracer. CT
data was obtained and used for attenuation correction and anatomic
localization.
FASTING BLOOD GLUCOSE:  Value: 91 mg/dl

[Series 4: ct sk_thigh 5.0 hd_fov · axial · 5.0mm · 1.12mm/px · 1 of 238 slices shown]
[im 238/238  brain]
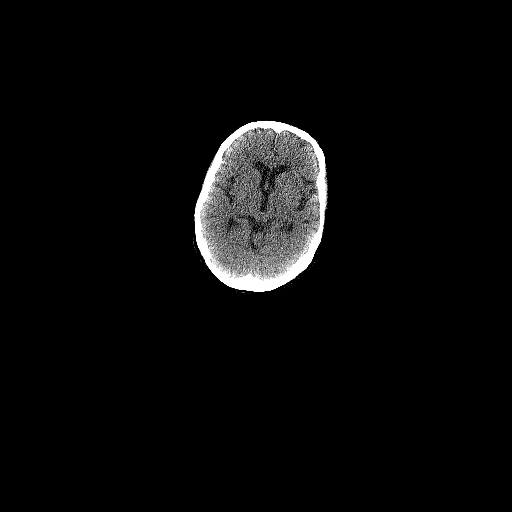

[1 of 25 positions shown; findings below may reference images not displayed]

FINDINGS: NECK

A hypermetabolic focus is seen in the left nasopharynx at the fossa
of Rosenmuller, with maximum SUV of 4.9. No corresponding soft
tissue mass is seen on CT images. No other hypermetabolic masses or
lymph nodes are identified within the neck.

CHEST

Large multi lobulated hypermetabolic mass is seen in the left upper
lobe and lingula measuring 8 cm, with involvement of the left hilum.
This has a maximum SUV of 37.8, consistent with primary bronchogenic
carcinoma. Mild hypermetabolic infiltrate is seen in the peripheral
lingula, consistent with postobstructive pneumonitis.

Hypermetabolic lymphadenopathy is seen in superior left hilum and in
the mediastinum in the AP window and lateral aortic region. Index
lymph node in the aortopulmonary window measures 1.5 cm, and has a
SUV max of 4.5. No contralateral hypermetabolic hilar or mediastinal
lymphadenopathy identified.

ABDOMEN/PELVIS

No abnormal hypermetabolic activity within the liver, pancreas, or
spleen. Large bilateral adrenal masses show intense nodular
hypermetabolic activity peripherally, consistent with large
bilateral adrenal metastases. Maximum SUV from the right adrenal
mass measures 20.8, and maximum SUV from the left adrenal mass
measures 17.0.

No hypermetabolic lymph nodes in the abdomen. A 2.3 cm
hypermetabolic left pelvic retroperitoneal masses seen adjacent to
the left iliacus muscle, which has a maximum SUV of 19.3, consistent
with metastatic disease.

Incidental note is made of a infrarenal abdominal aortic aneurysm
measuring 5.1 cm in maximum diameter. No evidence of aneurysm leak
or rupture.

SKELETON

Diffusely increased hypermetabolic activity is seen throughout the
bone marrow, however no focal areas of hypermetabolic skeletal
activity are seen to suggest the presence of bone metastases.
IMPRESSION: 8 cm hypermetabolic mass in the left upper lobe with involvement of
the left hilum, consistent with primary bronchogenic carcinoma.
Hypermetabolic postobstructive pneumonitis also seen in the
peripheral lingula.

Mild metastatic lymphadenopathy in the left hilum and ipsilateral
mediastinum.

Large bilateral adrenal metastases.

Hypermetabolic mass or lymphadenopathy in the left pelvic
retroperitoneum adjacent to the left iliacus muscle.

Hypermetabolic focus in the left nasopharynx fossa of Rosenmuller,
without visible mass or lymphadenopathy by CT. Suggest
nasopharyngoscopy for direct visualization.

5.1 cm infrarenal abdominal aortic aneurysm incidentally noted.
Recommend followup by abdomen pelvis CTA in 3 6 months, and consider
vascular surgery referral/consultation if not already obtained. This
recommendation follows ACR consensus guidelines: White paper of the
ACR incidental findings committee 2 on vascular findings. J Am Col
Radiol 2082: [DATE].

## 2015-02-04 IMAGING — CT CT HEAD W/O CM
2 series · 17 of 30 positions shown, 20 images · non-contrast
Comparison: PET-CT 04/01/2014

CLINICAL DATA: Mental status change. Findings consistent with
primary bronchogenic carcinoma on recent PET-CT.

EXAM:
CT HEAD WITHOUT CONTRAST
TECHNIQUE: Contiguous axial images were obtained from the base of the skull
through the vertex without intravenous contrast.

[Series 2: head w/o · axial · non-contrast · 0.43mm/px · z∈[-95,+20]mm · 9 of 29 slices shown, 12 images]
[im 3/29  brain]
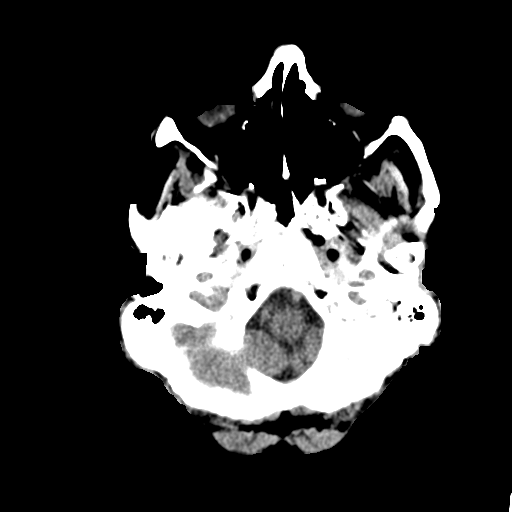
[im 3/29  bone]
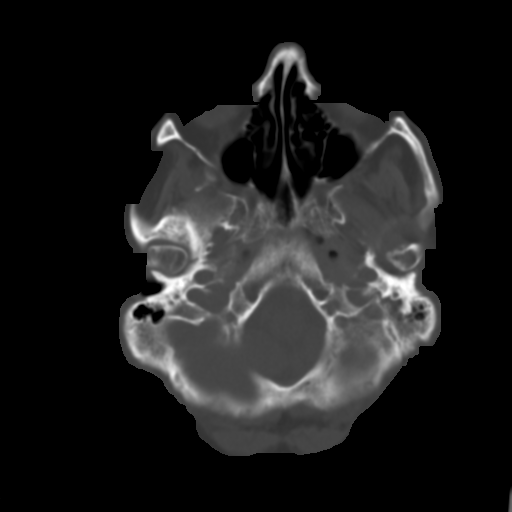
[im 6/29  brain]
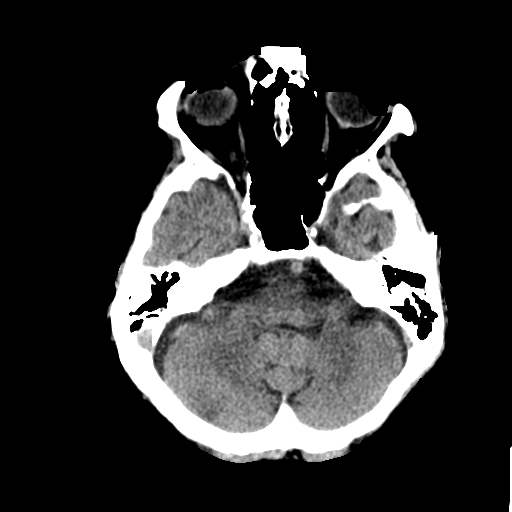
[im 9/29  brain]
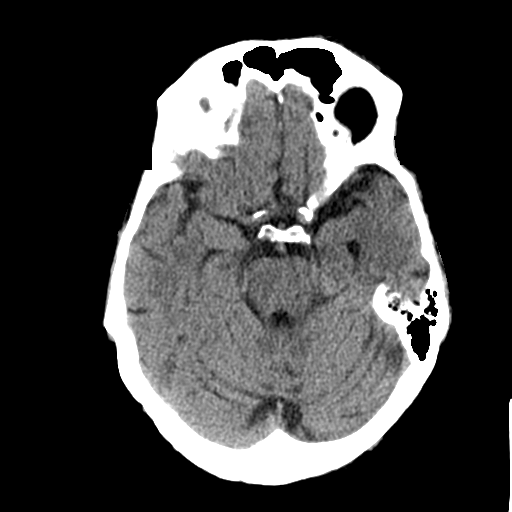
[im 12/29  brain]
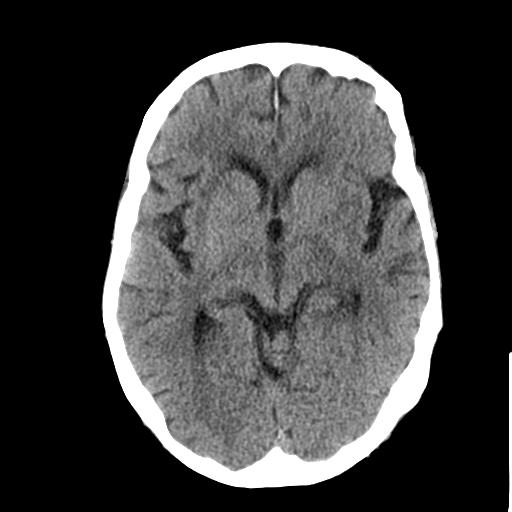
[im 15/29  brain]
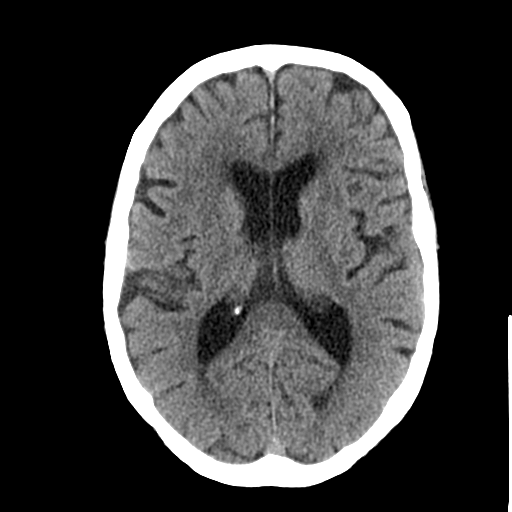
[im 15/29  bone]
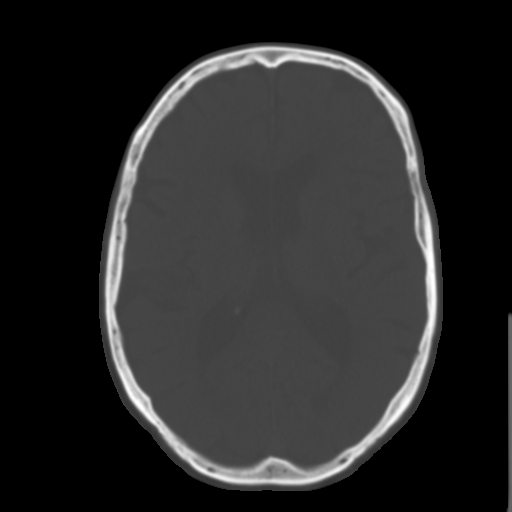
[im 17/29  brain]
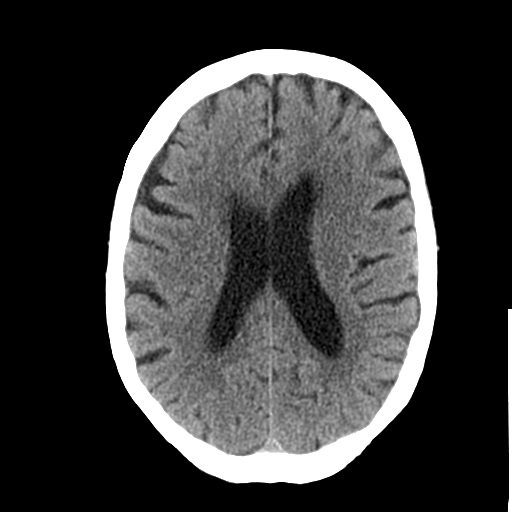
[im 20/29  brain]
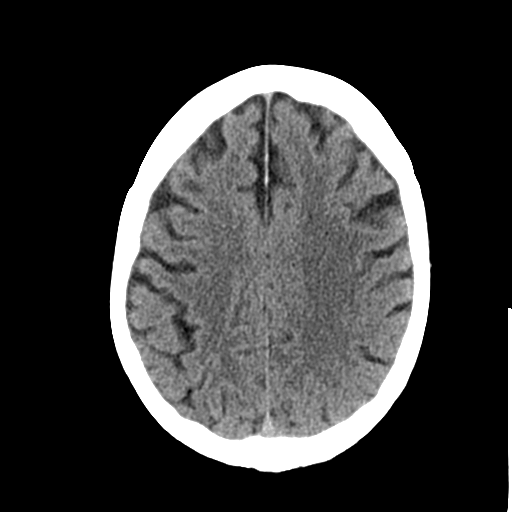
[im 23/29  brain]
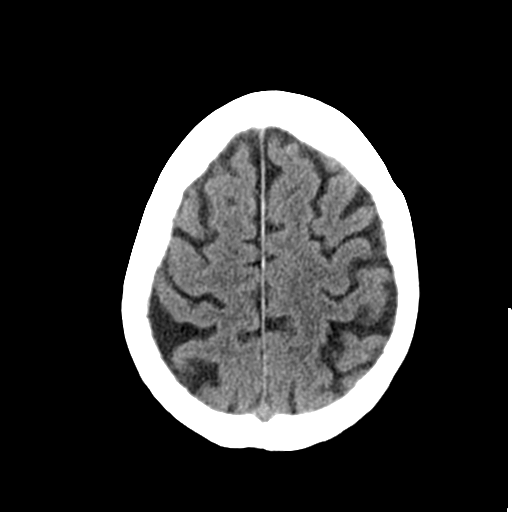
[im 26/29  brain]
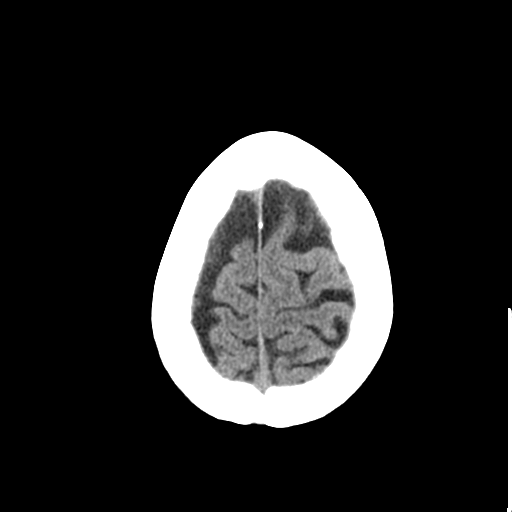
[im 26/29  bone]
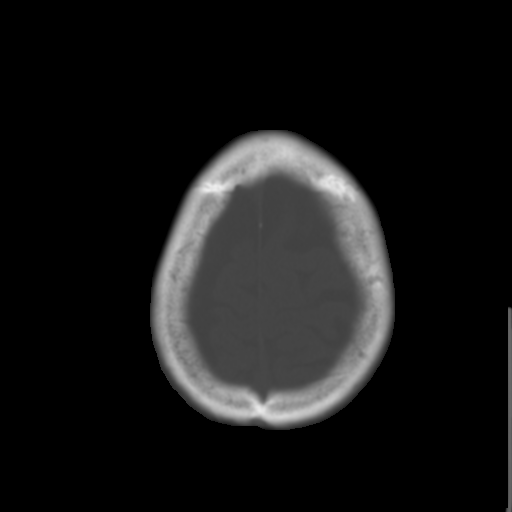

[Series 3: bone windows · axial · 0.43mm/px · z∈[-90,+18]mm · 8 of 48 slices shown]
[im 6/48  bone]
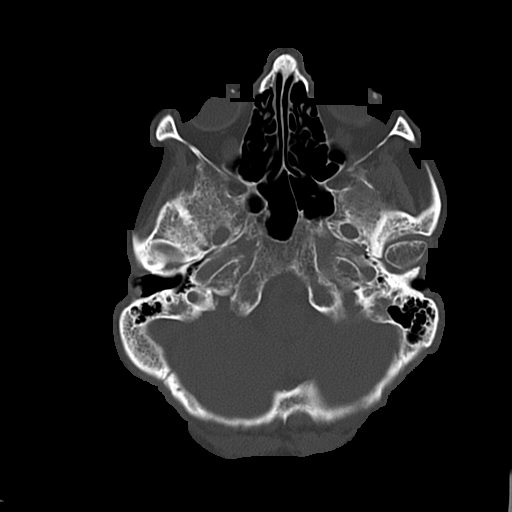
[im 11/48  bone]
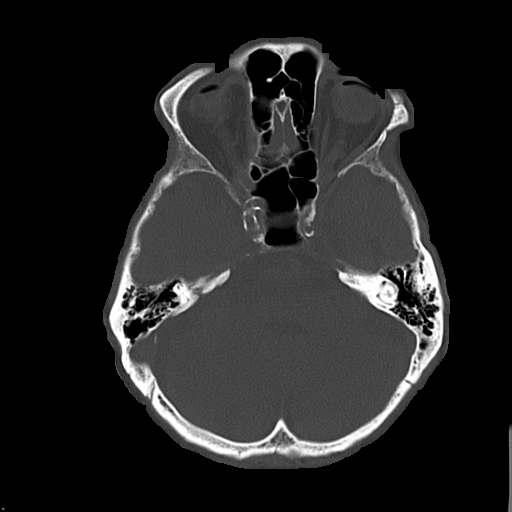
[im 16/48  bone]
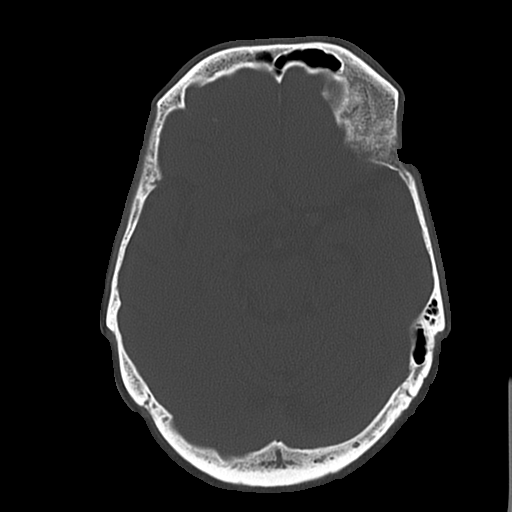
[im 21/48  bone]
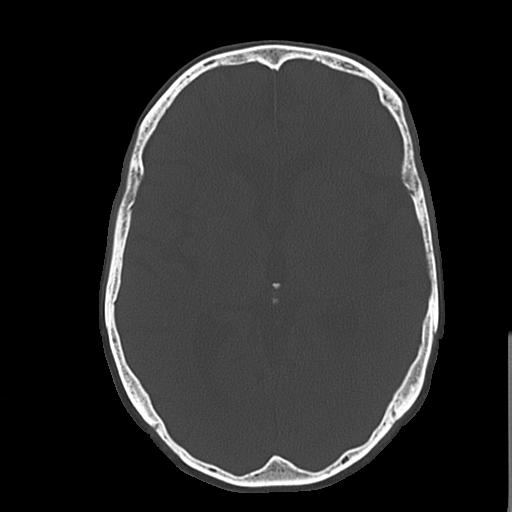
[im 27/48  bone]
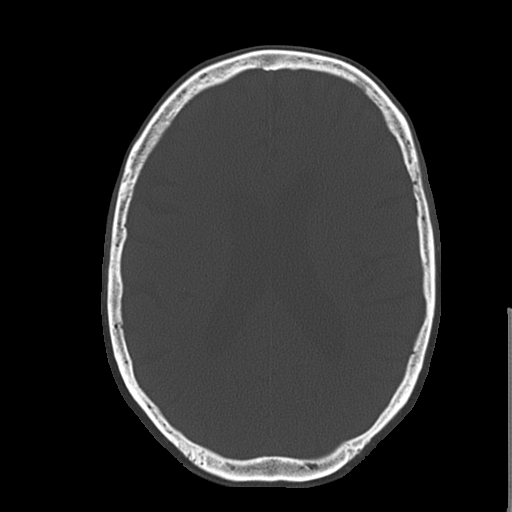
[im 32/48  bone]
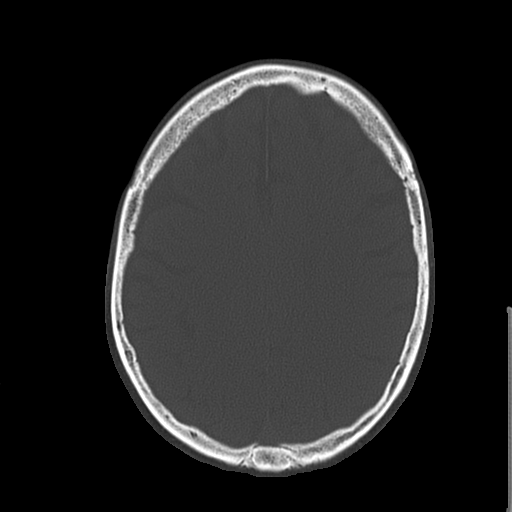
[im 37/48  bone]
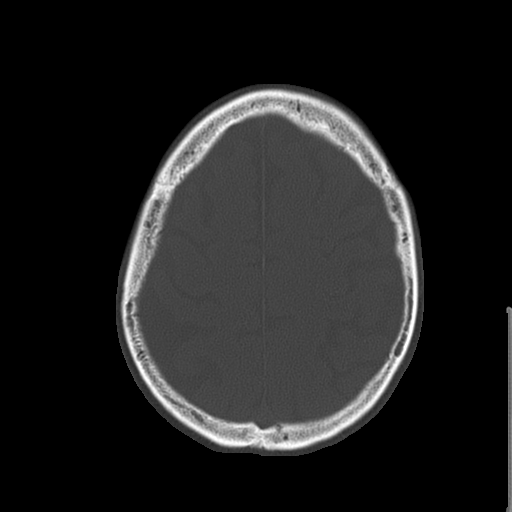
[im 42/48  bone]
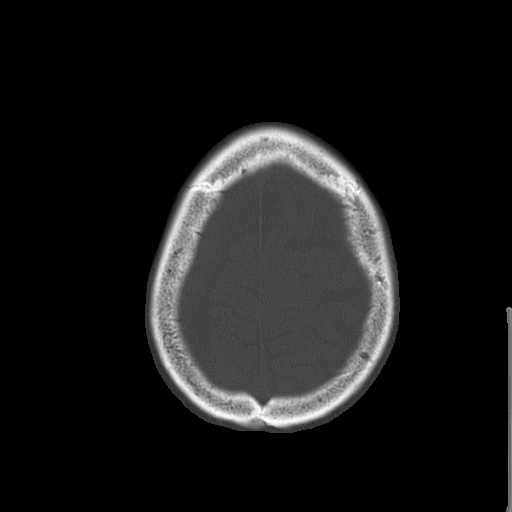

[17 of 30 positions shown; findings below may reference images not displayed]

FINDINGS: No acute intracranial abnormality is identified. Specifically, no
intra or extra-axial hemorrhage, mass lesion, mass effect,
hydrocephalus, or evidence of acute cortically based infarction.

The visualized paranasal sinuses and mastoid air cells are clear the
skull is intact. No suspicious osseous lesions.
IMPRESSION: No acute intracranial abnormality.
